# Patient Record
Sex: Male | Born: 1965
Health system: Southern US, Community
[De-identification: ages and names within clinical notes are randomized; demographics above are authoritative.]

## PROBLEM LIST (undated history)

## (undated) DIAGNOSIS — Z789 Other specified health status: Secondary | ICD-10-CM

## (undated) HISTORY — PX: KNEE SURGERY: SHX244

## (undated) HISTORY — PX: BACK SURGERY: SHX140

---

## 1988-01-29 HISTORY — PX: LUMBAR LAMINECTOMY/DECOMPRESSION MICRODISCECTOMY: SHX5026

## 1991-01-29 HISTORY — PX: KNEE SURGERY: SHX244

## 2002-07-05 ENCOUNTER — Ambulatory Visit (HOSPITAL_COMMUNITY): Admission: RE | Admit: 2002-07-05 | Discharge: 2002-07-05 | Payer: Self-pay | Admitting: Endocrinology

## 2002-07-05 ENCOUNTER — Encounter: Payer: Self-pay | Admitting: Endocrinology

## 2005-11-01 ENCOUNTER — Emergency Department (HOSPITAL_COMMUNITY): Admission: EM | Admit: 2005-11-01 | Discharge: 2005-11-01 | Payer: Self-pay | Admitting: Family Medicine

## 2009-06-28 ENCOUNTER — Ambulatory Visit: Payer: Self-pay | Admitting: Family Medicine

## 2010-02-27 NOTE — Assessment & Plan Note (Signed)
Summary: HEAD COLD   Vital Signs:  Patient Profile:   45 Years Old Male CC:      Chest congestion, headache, cough x 3 days Height:     72 inches Weight:      210 pounds O2 Sat:      99 % O2 treatment:    Room Air Temp:     98.4 degrees F oral Pulse rate:   51 / minute Pulse rhythm:   regular Resp:     12 per minute BP sitting:   136 / 88  (right arm) Cuff size:   large  Vitals Entered By: Emilio Math (June 28, 2009 8:27 AM)                  Prior Medication List:  No prior medications documented  Current Allergies: No known allergies History of Present Illness Chief Complaint: Chest congestion, headache, cough x 3 days History of Present Illness: Chest congestion and cough started 3 days ago. Yellowish productive sputum that has gotten worse today. Felt so bad he called into work today.   Current Problems: ACUTE NASOPHARYNGITIS (ICD-460) UPPER RESPIRATORY INFECTION, ACUTE (ICD-465.9)   Current Meds TUSSIONEX PENNKINETIC ER 8-10 MG/5ML LQCR (CHLORPHENIRAMINE-HYDROCODONE) sig 1 tsp by mouth twice a day MUCINEX D 60-600 MG XR12H-TAB (PSEUDOEPHEDRINE-GUAIFENESIN) sig 1 by mouth twice aday ZITHROMAX Z-PAK 250 MG  TABS (AZITHROMYCIN) Use as directed may fill if not better between 6/4-6/12/2009 * NASONEX OR FLONASE NASAL SPRAY 1 puff q day as needed  REVIEW OF SYSTEMS Constitutional Symptoms      Denies fever, chills, night sweats, weight loss, weight gain, and fatigue.  Eyes       Denies change in vision, eye pain, eye discharge, glasses, contact lenses, and eye surgery. Ear/Nose/Throat/Mouth       Complains of sinus problems.      Denies hearing loss/aids, change in hearing, ear pain, ear discharge, dizziness, frequent runny nose, frequent nose bleeds, hoarseness, and tooth pain or bleeding.  Respiratory       Complains of dry cough.      Denies productive cough, wheezing, shortness of breath, asthma, bronchitis, and emphysema/COPD.  Cardiovascular       Denies  murmurs, chest pain, and tires easily with exhertion.    Gastrointestinal       Denies stomach pain, nausea/vomiting, diarrhea, constipation, blood in bowel movements, and indigestion. Genitourniary       Denies painful urination, kidney stones, and loss of urinary control. Neurological       Complains of headaches.      Denies paralysis, seizures, and fainting/blackouts. Musculoskeletal       Denies muscle pain, joint pain, joint stiffness, decreased range of motion, redness, swelling, muscle weakness, and gout.  Skin       Denies bruising, unusual mles/lumps or sores, and hair/skin or nail changes.  Psych       Denies mood changes, temper/anger issues, anxiety/stress, speech problems, depression, and sleep problems.  Past History:  Family History: Last updated: 06/28/2009 Mother, Breast CA Father, Healthy  Social History: Last updated: 06/28/2009 Non-smoker No ETOH No Drugs [facility]  Past Medical History: Unremarkable  Past Surgical History: Denies surgical history  Family History: Reviewed history and no changes required. Mother, Breast CA Father, Healthy  Social History: Reviewed history and no changes required. Non-smoker No ETOH No Drugs [facility] Physical Exam General appearance: well developed, well nourished, no acute distress Head: normocephalic, atraumatic Nasal: mucosa pink, nonedematous, no septal  deviation, turbinates normal Oral/Pharynx: tongue normal, posterior pharynx without erythema or exudate Neck: supple,anterior lymphadenopathy present Chest/Lungs: no rales, wheezes, or rhonchi bilateral, breath sounds equal without effort Heart: regular rate and  rhythm, no murmur Skin: no obvious rashes or lesions MSE: oriented to time, place, and person Assessment New Problems: ACUTE NASOPHARYNGITIS (ICD-460) UPPER RESPIRATORY INFECTION, ACUTE (ICD-465.9)  URI   naso pharyngitis  Patient Education: Patient and/or caregiver instructed in the  following: rest fluids and Tylenol.  Plan New Medications/Changes: NASONEX OR FLONASE NASAL SPRAY 1 puff q day as needed  #0 x 0, 06/28/2009, Hassan Rowan MD ZITHROMAX Z-PAK 250 MG  TABS (AZITHROMYCIN) Use as directed may fill if not better between 6/4-6/12/2009  #1 x 0, 06/28/2009, Hassan Rowan MD MUCINEX D 60-600 MG XR12H-TAB (PSEUDOEPHEDRINE-GUAIFENESIN) sig 1 by mouth twice aday  #60 x 0, 06/28/2009, Hassan Rowan MD TUSSIONEX PENNKINETIC ER 8-10 MG/5ML LQCR (CHLORPHENIRAMINE-HYDROCODONE) sig 1 tsp by mouth twice a day  #58floz x 0, 06/28/2009, Hassan Rowan MD  New Orders: New Patient Level III [phone] Follow Up: Follow up on an as needed basis, Follow up with Primary Physician Work/School Excuse: Return to work/school in 3 days  The patient and/or caregiver has been counseled thoroughly with regard to medications prescribed including dosage, schedule, interactions, rationale for use, and possible side effects and they verbalize understanding.  Diagnoses and expected course of recovery discussed and will return if not improved as expected or if the condition worsens. Patient and/or caregiver verbalized understanding.  Prescriptions: NASONEX OR FLONASE NASAL SPRAY 1 puff q day as needed  #0 x 0   Entered and Authorized by:   Hassan Rowan MD   Signed by:   Hassan Rowan MD on 06/28/2009   Method used:   Print then Give to Patient   RxID:   [id number] ZITHROMAX Z-PAK 250 MG  TABS (AZITHROMYCIN) Use as directed may fill if not better between 6/4-6/12/2009  #1 x 0   Entered and Authorized by:   Hassan Rowan MD   Signed by:   Hassan Rowan MD on 06/28/2009   Method used:   Print then Give to Patient   RxID:   [id number] XLKGMWN D 60-600 MG XR12H-TAB (PSEUDOEPHEDRINE-GUAIFENESIN) sig 1 by mouth twice aday  #60 x 0   Entered and Authorized by:   Hassan Rowan MD   Signed by:   Hassan Rowan MD on 06/28/2009   Method used:   Print then Give to Patient   RxID:   [id number] TUSSIONEX  PENNKINETIC ER 8-10 MG/5ML LQCR (CHLORPHENIRAMINE-HYDROCODONE) sig 1 tsp by mouth twice a day  #1floz x 0   Entered and Authorized by:   Hassan Rowan MD   Signed by:   Hassan Rowan MD on 06/28/2009   Method used:   Print then Give to Patient   RxID:   [id number]   Patient Instructions: 1)  Please schedule an appointment with your primary doctor in : 2)   a follow-up appointment as needed. 3)  Acute sinusitis symptoms for less than 10 days are not helped by antibiotics.Use warm moist compresses, and over the counter decongestants ( only as directed). Call if no improvement in 5-7 days, sooner if increasing pain, fever, or new symptoms. 4)  Recommended remaining out of work for next 3 days.  Orders Added: 1)  New Patient Level III [88416]

## 2010-02-27 NOTE — Letter (Signed)
Summary: Out of Work  [facility]  [address]   [place], [place] [address]   Phone: [phone]  Fax: [phone]    June 28, 2009   Employee:  Jeffrey [employer]    To Whom It May Concern:   For Medical reasons, please excuse the above named employee from work for the following dates:  Start:   06/28/2009  Return:   07/01/2009  If you need additional information, please feel free to contact our office.         Sincerely,    Hassan Rowan MD

## 2012-01-09 ENCOUNTER — Ambulatory Visit (INDEPENDENT_AMBULATORY_CARE_PROVIDER_SITE_OTHER): Payer: 59 | Admitting: Internal Medicine

## 2012-01-09 ENCOUNTER — Ambulatory Visit: Payer: Self-pay | Admitting: Internal Medicine

## 2012-01-09 ENCOUNTER — Encounter: Payer: Self-pay | Admitting: Internal Medicine

## 2012-01-09 VITALS — BP 138/82 | HR 56 | Resp 16 | Wt 237.0 lb

## 2012-01-09 DIAGNOSIS — M549 Dorsalgia, unspecified: Secondary | ICD-10-CM

## 2012-01-09 DIAGNOSIS — M5416 Radiculopathy, lumbar region: Secondary | ICD-10-CM

## 2012-01-09 DIAGNOSIS — IMO0002 Reserved for concepts with insufficient information to code with codable children: Secondary | ICD-10-CM

## 2012-01-09 NOTE — Progress Notes (Signed)
  Subjective:    Patient ID: Jeffrey Burton, male    DOB: 07-01-1965, 46 y.o.   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 This is his first visit today. He has had back now for about 3 weeks radiating down left buttock into the left leg and left lateral leg. Hurts a lot at night. Hasn't been able to sleep well. Says that he had a lumbar procedure at [facility] in [place]  which sounds like a microdisectomy. Thinks it might have been at the L4-L5 level but cannot remember for sure. Doesn't recall any recent injury. He did try [place] machine at the gym about 3 or so weeks ago for some 45 minutes before onset of this pain. His general health is good. He's been taking some ibuprofen for the pain.  He is a native of [place] and formerly served in the [employer] in [place]. Subsequently moved to [place] to work in the [employer] and then to [place]. 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  History of fractured collarbone in the remote past. No chronic medications. No known drug allergies.  He is married and completed 2 years of college. Says he had a bout of herpes zoster a few years ago when going through a divorce. Patient does not smoke. Social alcohol consumption. One stepson and one son.  Family history: Father with history of hypertension and obesity living at age 36. Mother died of metastatic breast cancer. One brother age 28 who state of health is unknown to patient.    Review of Systems pain in the left back radiating into the left buttock and down left posterior thigh. Also pain left lateral ankle. No weakness in left lower extremity but finds himself unable to plan foot flat on the ground. Unable to sit still or stand still for any length of time because of discomfort     Objective:   Physical Exam straight leg raising is positive at 90 on the left.  Negative at 90 on the right. Deep tendon reflexes are 2+ and symmetrical in the lower extremities. Muscle strength is 5 over 5 in the left lower extremity. No foot drop is noted.        Assessment & Plan:  History of left lumbar disc disease status post discectomy 1992  New onset probable L5-S1 radiculopathy  Plan: Sterapred DS 10 mg 12 day dosepak. MRI of the LS-spine. Flexeril 10 mg proceed #30) 1/2-1 by mouth each bedtime. Lorcet 10/650 (#90) one by mouth every 8 hours when necessary pain.

## 2012-01-09 NOTE — Patient Instructions (Addendum)
Take Sterapred DS 10 mg 12 day dosepak as directed. Take Flexeril 1/2-1 tablet at night. Take Lorcet sparingly for pain. Have MRI of LS-spine and on December 15. Return in 2 weeks.

## 2012-01-12 ENCOUNTER — Other Ambulatory Visit: Payer: 59

## 2012-01-12 ENCOUNTER — Ambulatory Visit
Admission: RE | Admit: 2012-01-12 | Discharge: 2012-01-12 | Disposition: A | Discharge status: Home / Self Care | Payer: 59 | Source: Ambulatory Visit | Attending: Internal Medicine | Admitting: Internal Medicine

## 2012-01-12 DIAGNOSIS — M549 Dorsalgia, unspecified: Secondary | ICD-10-CM

## 2012-01-12 MED ORDER — GADOBENATE DIMEGLUMINE 529 MG/ML IV SOLN
20.0000 mL | Freq: Once | INTRAVENOUS | Status: AC | PRN
  Administered 2012-01-12: 20 mL via INTRAVENOUS

## 2012-01-13 NOTE — Progress Notes (Signed)
Patient informed. 

## 2012-02-06 ENCOUNTER — Telehealth: Payer: Self-pay | Admitting: Internal Medicine

## 2012-02-06 NOTE — Telephone Encounter (Signed)
He is asking you for a referral to a physician that you feel would best be suited to assist him with the issues he is/has been having.

## 2012-02-06 NOTE — Telephone Encounter (Signed)
Please see if Dr. Venetia Maxon will see him

## 2012-02-06 NOTE — Telephone Encounter (Signed)
Had suggested neurosurgeon previously but pt. Wanted to wait. Who does he want to see?

## 2012-02-07 NOTE — Telephone Encounter (Signed)
Referral faxed to Dr. Venetia Maxon at [facility]

## 2012-02-19 ENCOUNTER — Encounter (HOSPITAL_COMMUNITY): Payer: Self-pay | Admitting: Pharmacy Technician

## 2012-02-19 ENCOUNTER — Other Ambulatory Visit: Payer: Self-pay | Admitting: Neurosurgery

## 2012-02-20 ENCOUNTER — Encounter (HOSPITAL_COMMUNITY)
Admission: RE | Admit: 2012-02-20 | Discharge: 2012-02-20 | Disposition: A | Discharge status: Home / Self Care | Payer: 59 | Source: Ambulatory Visit | Attending: Neurosurgery | Admitting: Neurosurgery

## 2012-02-20 ENCOUNTER — Encounter (HOSPITAL_COMMUNITY): Payer: Self-pay | Admitting: Pharmacy Technician

## 2012-02-20 ENCOUNTER — Encounter (HOSPITAL_COMMUNITY): Payer: Self-pay

## 2012-02-20 HISTORY — DX: Other specified health status: Z78.9

## 2012-02-20 LAB — CBC
HCT: 41.7 % (ref 39.0–52.0)
MCH: 29.3 pg (ref 26.0–34.0)
MCHC: 35 g/dL (ref 30.0–36.0)
MCV: 83.7 fL (ref 78.0–100.0)
Platelets: 258 10*3/uL (ref 150–400)
RDW: 12.8 % (ref 11.5–15.5)

## 2012-02-20 MED ORDER — CEFAZOLIN SODIUM-DEXTROSE 2-3 GM-% IV SOLR
2.0000 g | INTRAVENOUS | Status: AC
  Administered 2012-02-21: 2 g via INTRAVENOUS

## 2012-02-20 NOTE — H&P (Signed)
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 He describes left thigh and calf pain radiating into his shin. He said he cannot walk and has to "clomp my foot".  He says this has been going on for seven weeks.  It is now becoming disabling. He is unable to sleep and unable to sit.  He has to kneel at his desk and describes that he is "miserable".  He describes the most intense pain other than the pain his left buttock is on the side of his left shin.  He had a previous left L4-5 discectomy in 09/1990 by Dr. Rutha Burton.  He has had intermittent flare-ups for about a year, but has had increased intensity and frequency from November through December, but as of December 10th through the 15th his pain became much more severe. He says it is constant lately.  He has taken Hydrocodone which causes severe nausea.  Motrin also causes nausea.  He does yoga exercises, but says this increase his pain. He is otherwise healthy.    REVIEW OF SYSTEMS:   A detailed Review of Systems sheet was reviewed with the patient.  Pertinent positives include under cardiovascular - he notes leg pain with walking, under musculoskeletal - he notes leg pain. All other systems are negative; this includes Constitutional symptoms, Eyes, Ears, nose, mouth, throat, Endocrine, Respiratory, Gastrointestinal, Genitourinary, Integumentary & Breast, Neurologic, Psychiatric, Hematologic/Lymphatic, Allergic/Immunologic.    PAST MEDICAL HISTORY:      Current Medical Conditions:    As previously described above.     Prior Operations and Hospitalizations:   As previously described above.      Medications and Allergies:  Current medications - he takes no scheduled medications.  He has no known drug allergies.      Height and Weight:     He is 6'  tall, 220 lbs.  BMI is 29.5.    FAMILY HISTORY:    His mother died at age 48 with metastatic breast cancer.  His father is 72 with diabetes and high blood pressure.    SOCIAL HISTORY:    He denies tobacco or drug use.  He is a social drinker of alcoholic beverages.    DIAGNOSTIC STUDIES:   Mr. Reali recently started seeing Dr. Lenord Burton, his primary care physician, who referred him to me.  She had an MRI performed of his lumbar spine which shows a previous laminectomy at L4-5 on the left, with mild facet arthropathy and a left-sided disc extrusion into the lateral recess sign significant left L5 nerve root encroachment.  No apparent L4 nerve root encroachment.    Plain radiographs were reviewed in the office which show a previous laminectomy at L4-5 and spondylosis and degenerative changes at this level.    PHYSICAL EXAMINATION:      General Appearance:   On examination today, Jeffrey Burton is a pleasant and cooperative man in no acute distress.      Blood Pressure, Pulse:     His blood pressure is 140/94.  Heart rate is 80 and regular.  Respiratory rate is 16.      HEENT - normocephalic, atraumatic.  The pupils are equal, round and reactive to light.  The extraocular muscles are intact.  Sclerae - white.  Conjunctiva - pink.  Oropharynx benign.  Uvula midline.     Neck - there are no masses, meningismus, deformities, tracheal deviation, jugular vein distention or carotid bruits.  There is normal cervical range of motion.  Spurlings' test is negative without reproducible radicular pain turning the patient's head to either side.  Lhermitte's sign is not present with axial compression.      Respiratory - there is normal respiratory effort with good intercostal function.  Lungs are clear to auscultation.  There are no rales, rhonchi or wheezes.      Cardiovascular - the heart has regular rate and rhythm to auscultation.  No murmurs are appreciated.  There is no extremity edema, cyanosis or  clubbing.  There are palpable pedal pulses.      Abdomen - soft, nontender, no hepatosplenomegaly appreciated or masses.  There are active bowel sounds.  No guarding or rebound.        Musculoskeletal Examination - he is very limited in both standing and also in forward bending. He is able to flex to 10 degrees off the vertical.  He has left sciatic notch discomfort.  He has midline low back pain to palpation.  He is able to stand on his toes on the right, decreased ability to stand on his heel on the left.  He has a positive straight leg raise at 15 degrees.    NEUROLOGICAL EXAMINATION: The patient is oriented to time, person and place and has good recall of both recent and remote memory with normal attention span and concentration.  The patient speaks with clear and fluent speech and exhibits normal language function and appropriate fund of knowledge.      Cranial Nerve Examination - pupils are equal, round and reactive to light.  Extraocular movements are full.  Visual fields are full to confrontational testing.  Facial sensation and facial movement are symmetric and intact.  Hearing is intact to finger rub.  Palate is upgoing.  Shoulder shrug is symmetric.  Tongue protrudes in the midline.      Motor Examination - motor strength is 5/5 in the bilateral deltoids, biceps, triceps, handgrips, wrist extensors, interosseous.  In the lower extremities motor strength is 5/5 in hip flexion, extension, quadriceps, hamstrings, plantar flexion, dorsiflexion, 5/5 right extensor hallucis longus, and left extensor hallucis longus strength at 4/5 and left hip abductor strength at 4/5.    Sensory Examination - normal to light touch and pinprick sensation in the upper and lower extremities.     Deep Tendon Reflexes - 2 in the biceps, triceps, and brachioradialis, 2 in the knees, 2 in the ankles.  The great toes are downgoing to plantar stimulation.      Cerebellar Examination - normal coordination in upper and  lower extremities and normal rapid alternating movements.  Romberg test is negative.    IMPRESSION AND RECOMMENDATIONS: Jeffrey Burton is a 47 year old man with a severe left L5 radiculopathy with recurrent disc herniation at L4-5 on the left.  I have recommended that he undergo re-do left L4-5 microdiscectomy and have set this up on an expedited basis in the next few days to hopefully give him some relief of his severe and incapacitating pain.  Risks and benefits were discussed with the patient who wishes to proceed.   I reviewed the studies with the patient and went over his physical examination.  I reviewed surgical models and discussed the typical hospital course and operative and postoperative course and the potential risks and benefits of surgery.  The risks of  surgery were discussed in detail and include, but are not limited to, the risks of anesthesia, blood loss and the possibility of hemorrhage, infection, damage to nerves, damage to blood vessels, injury to the lumbar nerve root causing either temporary or permanent leg pain, numbness, weakness.  There is potential for spinal fluid leak from dural tear.  There is the potential for post-laminectomy spondylolisthesis, recurrent disc ruptured quoted at approximately 10%, failure to relieve pain, worsening of pain, need for further surgery.    [facility]    Danae Orleans. Venetia Maxon, M.D.  JDS:aft cc: Dr. Eden Emms [facility]

## 2012-02-20 NOTE — Progress Notes (Signed)
Primary Physician - Dr. Lenord Fellers Does not have a cardiologist - no previous cardiac testing.

## 2012-02-20 NOTE — Pre-Procedure Instructions (Signed)
Jeffrey Burton  02/20/2012   Your procedure is scheduled on:  Friday, January 24th  Report to [facility] at 0530 AM.  Call this number if you have problems the morning of surgery: [phone]   Remember:   Do not eat food or drink liquids after midnight.    Take these medicines the morning of surgery with A SIP OF WATER: percocet if needed   Do not wear jewelry, make-up or nail polish.  Do not wear lotions, powders, or perfumes. You may not wear deodorant.  Do not shave 48 hours prior to surgery. Men may shave face and neck.  Do not bring valuables to the hospital.  Contacts, dentures or bridgework may not be worn into surgery.  Leave suitcase in the car. After surgery it may be brought to your room.  For patients admitted to the hospital, checkout time is 11:00 AM the day of  discharge.   Patients discharged the day of surgery will not be allowed to drive  home.    Special Instructions: Shower using CHG 2 nights before surgery and the night before surgery.  If you shower the day of surgery use CHG.  Use special wash - you have one bottle of CHG for all showers.  You should use approximately 1/3 of the bottle for each shower.   Please read over the following fact sheets that you were given: Pain Booklet, Coughing and Deep Breathing, MRSA Information and Surgical Site Infection Prevention

## 2012-02-21 ENCOUNTER — Ambulatory Visit (HOSPITAL_COMMUNITY)
Admission: RE | Admit: 2012-02-21 | Discharge: 2012-02-21 | Disposition: A | Discharge status: Home / Self Care | Payer: 59 | Source: Ambulatory Visit | Attending: Neurosurgery | Admitting: Neurosurgery

## 2012-02-21 ENCOUNTER — Ambulatory Visit (HOSPITAL_COMMUNITY): Payer: 59 | Admitting: Anesthesiology

## 2012-02-21 ENCOUNTER — Encounter (HOSPITAL_COMMUNITY): Payer: Self-pay | Admitting: *Deleted

## 2012-02-21 ENCOUNTER — Encounter (HOSPITAL_COMMUNITY): Payer: Self-pay | Admitting: Anesthesiology

## 2012-02-21 ENCOUNTER — Encounter (HOSPITAL_COMMUNITY)
Admission: RE | Disposition: A | Discharge status: Home / Self Care | Payer: Self-pay | Source: Ambulatory Visit | Attending: Neurosurgery

## 2012-02-21 ENCOUNTER — Ambulatory Visit (HOSPITAL_COMMUNITY): Payer: 59

## 2012-02-21 DIAGNOSIS — M5137 Other intervertebral disc degeneration, lumbosacral region: Secondary | ICD-10-CM | POA: Insufficient documentation

## 2012-02-21 DIAGNOSIS — Z01812 Encounter for preprocedural laboratory examination: Secondary | ICD-10-CM | POA: Insufficient documentation

## 2012-02-21 DIAGNOSIS — M47817 Spondylosis without myelopathy or radiculopathy, lumbosacral region: Secondary | ICD-10-CM | POA: Insufficient documentation

## 2012-02-21 DIAGNOSIS — M51379 Other intervertebral disc degeneration, lumbosacral region without mention of lumbar back pain or lower extremity pain: Secondary | ICD-10-CM | POA: Insufficient documentation

## 2012-02-21 DIAGNOSIS — M5126 Other intervertebral disc displacement, lumbar region: Secondary | ICD-10-CM | POA: Insufficient documentation

## 2012-02-21 HISTORY — PX: LUMBAR LAMINECTOMY/DECOMPRESSION MICRODISCECTOMY: SHX5026

## 2012-02-21 SURGERY — LUMBAR LAMINECTOMY/DECOMPRESSION MICRODISCECTOMY 1 LEVEL
Anesthesia: General | Site: Back | Laterality: Left | Wound class: Clean

## 2012-02-21 MED ORDER — ZOLPIDEM TARTRATE 5 MG PO TABS: 5.0000 mg | ORAL_TABLET | Freq: Every evening | ORAL | Status: DC | PRN

## 2012-02-21 MED ORDER — NEOSTIGMINE METHYLSULFATE 1 MG/ML IJ SOLN
INTRAMUSCULAR | Status: DC | PRN
  Administered 2012-02-21: 3 mg via INTRAVENOUS

## 2012-02-21 MED ORDER — ACETAMINOPHEN 10 MG/ML IV SOLN
1000.0000 mg | Freq: Once | INTRAVENOUS | Status: AC
  Administered 2012-02-21: 1000 mg via INTRAVENOUS

## 2012-02-21 MED ORDER — BUPIVACAINE HCL (PF) 0.5 % IJ SOLN
INTRAMUSCULAR | Status: DC | PRN
  Administered 2012-02-21: 5 mL

## 2012-02-21 MED ORDER — KCL IN DEXTROSE-NACL 20-5-0.45 MEQ/L-%-% IV SOLN
INTRAVENOUS | Status: DC
  Filled 2012-02-21 (×2): qty 1000

## 2012-02-21 MED ORDER — PANTOPRAZOLE SODIUM 40 MG IV SOLR
40.0000 mg | Freq: Every day | INTRAVENOUS | Status: DC
  Filled 2012-02-21: qty 40

## 2012-02-21 MED ORDER — METHYLPREDNISOLONE ACETATE 80 MG/ML IJ SUSP
INTRAMUSCULAR | Status: DC | PRN
  Administered 2012-02-21: 80 mg

## 2012-02-21 MED ORDER — SODIUM CHLORIDE 0.9 % IJ SOLN: 3.0000 mL | Freq: Two times a day (BID) | INTRAMUSCULAR | Status: DC

## 2012-02-21 MED ORDER — DOCUSATE SODIUM 100 MG PO CAPS: 100.0000 mg | ORAL_CAPSULE | Freq: Two times a day (BID) | ORAL | Status: DC

## 2012-02-21 MED ORDER — ACETAMINOPHEN 650 MG RE SUPP: 650.0000 mg | RECTAL | Status: DC | PRN

## 2012-02-21 MED ORDER — FENTANYL CITRATE 0.05 MG/ML IJ SOLN: 50.0000 ug | Freq: Once | INTRAMUSCULAR | Status: DC

## 2012-02-21 MED ORDER — ROCURONIUM BROMIDE 100 MG/10ML IV SOLN
INTRAVENOUS | Status: DC | PRN
  Administered 2012-02-21: 50 mg via INTRAVENOUS

## 2012-02-21 MED ORDER — CEFAZOLIN SODIUM 1-5 GM-% IV SOLN
1.0000 g | Freq: Three times a day (TID) | INTRAVENOUS | Status: DC
  Administered 2012-02-21: 1 g via INTRAVENOUS
  Filled 2012-02-21 (×2): qty 50

## 2012-02-21 MED ORDER — CEFAZOLIN SODIUM-DEXTROSE 2-3 GM-% IV SOLR
INTRAVENOUS | Status: AC
  Filled 2012-02-21: qty 50

## 2012-02-21 MED ORDER — HEMOSTATIC AGENTS (NO CHARGE) OPTIME
TOPICAL | Status: DC | PRN
  Administered 2012-02-21: 1 via TOPICAL

## 2012-02-21 MED ORDER — HYDROMORPHONE HCL PF 1 MG/ML IJ SOLN
INTRAMUSCULAR | Status: AC
  Filled 2012-02-21: qty 1

## 2012-02-21 MED ORDER — SENNA 8.6 MG PO TABS: 1.0000 | ORAL_TABLET | Freq: Two times a day (BID) | ORAL | Status: DC

## 2012-02-21 MED ORDER — SENNOSIDES-DOCUSATE SODIUM 8.6-50 MG PO TABS
1.0000 | ORAL_TABLET | Freq: Every evening | ORAL | Status: DC | PRN
  Filled 2012-02-21: qty 1

## 2012-02-21 MED ORDER — ACETAMINOPHEN 10 MG/ML IV SOLN
INTRAVENOUS | Status: AC
  Filled 2012-02-21: qty 100

## 2012-02-21 MED ORDER — SODIUM CHLORIDE 0.9 % IV SOLN
INTRAVENOUS | Status: AC
  Filled 2012-02-21: qty 500

## 2012-02-21 MED ORDER — BISACODYL 10 MG RE SUPP: 10.0000 mg | Freq: Every day | RECTAL | Status: DC | PRN

## 2012-02-21 MED ORDER — HYDROCODONE-ACETAMINOPHEN 5-325 MG PO TABS: 1.0000 | ORAL_TABLET | ORAL | Status: DC | PRN

## 2012-02-21 MED ORDER — THROMBIN 5000 UNITS EX KIT
PACK | CUTANEOUS | Status: DC | PRN
  Administered 2012-02-21 (×2): 5000 [IU] via TOPICAL

## 2012-02-21 MED ORDER — DIAZEPAM 5 MG PO TABS: 5.0000 mg | ORAL_TABLET | Freq: Four times a day (QID) | ORAL | Status: DC | PRN

## 2012-02-21 MED ORDER — GLYCOPYRROLATE 0.2 MG/ML IJ SOLN
INTRAMUSCULAR | Status: DC | PRN
  Administered 2012-02-21: 0.4 mg via INTRAVENOUS

## 2012-02-21 MED ORDER — PHENOL 1.4 % MT LIQD: 1.0000 | OROMUCOSAL | Status: DC | PRN

## 2012-02-21 MED ORDER — MIDAZOLAM HCL 2 MG/2ML IJ SOLN: 1.0000 mg | INTRAMUSCULAR | Status: DC | PRN

## 2012-02-21 MED ORDER — ONDANSETRON HCL 4 MG/2ML IJ SOLN: 4.0000 mg | INTRAMUSCULAR | Status: DC | PRN

## 2012-02-21 MED ORDER — ARTIFICIAL TEARS OP OINT
TOPICAL_OINTMENT | OPHTHALMIC | Status: DC | PRN
  Administered 2012-02-21: 1 via OPHTHALMIC

## 2012-02-21 MED ORDER — SODIUM CHLORIDE 0.9 % IV SOLN: 250.0000 mL | INTRAVENOUS | Status: DC

## 2012-02-21 MED ORDER — HYDROMORPHONE HCL PF 1 MG/ML IJ SOLN
0.2500 mg | INTRAMUSCULAR | Status: DC | PRN
  Administered 2012-02-21 (×2): 0.5 mg via INTRAVENOUS

## 2012-02-21 MED ORDER — OXYCODONE HCL 5 MG/5ML PO SOLN: 5.0000 mg | Freq: Once | ORAL | Status: DC | PRN

## 2012-02-21 MED ORDER — LACTATED RINGERS IV SOLN
INTRAVENOUS | Status: DC | PRN
  Administered 2012-02-21 (×2): via INTRAVENOUS

## 2012-02-21 MED ORDER — FENTANYL CITRATE 0.05 MG/ML IJ SOLN
INTRAMUSCULAR | Status: DC | PRN
  Administered 2012-02-21: 100 ug via INTRAVENOUS

## 2012-02-21 MED ORDER — ONDANSETRON HCL 4 MG/2ML IJ SOLN
INTRAMUSCULAR | Status: DC | PRN
  Administered 2012-02-21: 4 mg via INTRAVENOUS

## 2012-02-21 MED ORDER — MIDAZOLAM HCL 5 MG/5ML IJ SOLN
INTRAMUSCULAR | Status: DC | PRN
  Administered 2012-02-21: 2 mg via INTRAVENOUS

## 2012-02-21 MED ORDER — 0.9 % SODIUM CHLORIDE (POUR BTL) OPTIME
TOPICAL | Status: DC | PRN
  Administered 2012-02-21: 1000 mL

## 2012-02-21 MED ORDER — ACETAMINOPHEN 325 MG PO TABS: 650.0000 mg | ORAL_TABLET | ORAL | Status: DC | PRN

## 2012-02-21 MED ORDER — FLEET ENEMA 7-19 GM/118ML RE ENEM
1.0000 | ENEMA | Freq: Once | RECTAL | Status: DC | PRN
  Filled 2012-02-21: qty 1

## 2012-02-21 MED ORDER — OXYCODONE-ACETAMINOPHEN 5-325 MG PO TABS: 1.0000 | ORAL_TABLET | ORAL | Status: DC | PRN

## 2012-02-21 MED ORDER — FENTANYL CITRATE 0.05 MG/ML IJ SOLN
INTRAMUSCULAR | Status: AC
  Filled 2012-02-21: qty 2

## 2012-02-21 MED ORDER — LIDOCAINE-EPINEPHRINE 1 %-1:100000 IJ SOLN
INTRAMUSCULAR | Status: DC | PRN
  Administered 2012-02-21: 5 mL

## 2012-02-21 MED ORDER — ALUM & MAG HYDROXIDE-SIMETH 200-200-20 MG/5ML PO SUSP: 30.0000 mL | Freq: Four times a day (QID) | ORAL | Status: DC | PRN

## 2012-02-21 MED ORDER — MORPHINE SULFATE 2 MG/ML IJ SOLN
1.0000 mg | INTRAMUSCULAR | Status: DC | PRN
  Administered 2012-02-21: 2 mg via INTRAVENOUS
  Filled 2012-02-21: qty 1

## 2012-02-21 MED ORDER — FENTANYL CITRATE 0.05 MG/ML IJ SOLN
INTRAMUSCULAR | Status: DC | PRN
  Administered 2012-02-21: 150 ug via INTRAVENOUS
  Administered 2012-02-21: 100 ug via INTRAVENOUS

## 2012-02-21 MED ORDER — SODIUM CHLORIDE 0.9 % IJ SOLN: 3.0000 mL | INTRAMUSCULAR | Status: DC | PRN

## 2012-02-21 MED ORDER — BACITRACIN 50000 UNITS IM SOLR
INTRAMUSCULAR | Status: AC
  Filled 2012-02-21: qty 1

## 2012-02-21 MED ORDER — LIDOCAINE HCL (CARDIAC) 20 MG/ML IV SOLN
INTRAVENOUS | Status: DC | PRN
  Administered 2012-02-21: 100 mg via INTRAVENOUS

## 2012-02-21 MED ORDER — SODIUM CHLORIDE 0.9 % IR SOLN
Status: DC | PRN
  Administered 2012-02-21: 08:00:00

## 2012-02-21 MED ORDER — OXYCODONE HCL 5 MG PO TABS: 5.0000 mg | ORAL_TABLET | Freq: Once | ORAL | Status: DC | PRN

## 2012-02-21 MED ORDER — PROPOFOL 10 MG/ML IV BOLUS
INTRAVENOUS | Status: DC | PRN
  Administered 2012-02-21: 50 mg via INTRAVENOUS
  Administered 2012-02-21: 200 mg via INTRAVENOUS

## 2012-02-21 MED ORDER — OXYCODONE-ACETAMINOPHEN 5-325 MG PO TABS
1.0000 | ORAL_TABLET | ORAL | Status: DC | PRN
Start: 2012-02-21 — End: 2012-02-21

## 2012-02-21 MED ORDER — MENTHOL 3 MG MT LOZG: 1.0000 | LOZENGE | OROMUCOSAL | Status: DC | PRN

## 2012-02-21 MED ORDER — DEXTROSE 5 % IV SOLN
INTRAVENOUS | Status: DC | PRN
  Administered 2012-02-21 (×2): via INTRAVENOUS

## 2012-02-21 MED ORDER — PROMETHAZINE HCL 25 MG/ML IJ SOLN: 6.2500 mg | INTRAMUSCULAR | Status: DC | PRN

## 2012-02-21 SURGICAL SUPPLY — 61 items
ADH SKN CLS APL DERMABOND .7 (GAUZE/BANDAGES/DRESSINGS) ×1
APL SKNCLS STERI-STRIP NONHPOA (GAUZE/BANDAGES/DRESSINGS)
BAG DECANTER FOR FLEXI CONT (MISCELLANEOUS) ×2 IMPLANT
BENZOIN TINCTURE PRP APPL 2/3 (GAUZE/BANDAGES/DRESSINGS) IMPLANT
BIT DRILL NEURO 2X3.1 SFT TUCH (MISCELLANEOUS) ×1 IMPLANT
BLADE SURG ROTATE 9660 (MISCELLANEOUS) IMPLANT
BUR ROUND FLUTED 5 RND (BURR) ×2 IMPLANT
CANISTER SUCTION 2500CC (MISCELLANEOUS) ×2 IMPLANT
CLOTH BEACON ORANGE TIMEOUT ST (SAFETY) ×2 IMPLANT
CONT SPEC 4OZ CLIKSEAL STRL BL (MISCELLANEOUS) ×2 IMPLANT
DERMABOND ADVANCED (GAUZE/BANDAGES/DRESSINGS) ×1
DERMABOND ADVANCED .7 DNX12 (GAUZE/BANDAGES/DRESSINGS) ×1 IMPLANT
DRAPE LAPAROTOMY 100X72X124 (DRAPES) ×2 IMPLANT
DRAPE MICROSCOPE LEICA (MISCELLANEOUS) ×2 IMPLANT
DRAPE POUCH INSTRU U-SHP 10X18 (DRAPES) ×2 IMPLANT
DRAPE PROXIMA HALF (DRAPES) ×1 IMPLANT
DRAPE SURG 17X23 STRL (DRAPES) ×2 IMPLANT
DRESSING TELFA 8X3 (GAUZE/BANDAGES/DRESSINGS) IMPLANT
DRILL NEURO 2X3.1 SOFT TOUCH (MISCELLANEOUS) ×2
DURAPREP 26ML APPLICATOR (WOUND CARE) ×2 IMPLANT
ELECT REM PT RETURN 9FT ADLT (ELECTROSURGICAL) ×2
ELECTRODE REM PT RTRN 9FT ADLT (ELECTROSURGICAL) ×1 IMPLANT
GAUZE SPONGE 4X4 16PLY XRAY LF (GAUZE/BANDAGES/DRESSINGS) IMPLANT
GLOVE BIO SURGEON STRL SZ8 (GLOVE) ×2 IMPLANT
GLOVE BIOGEL PI IND STRL 7.0 (GLOVE) IMPLANT
GLOVE BIOGEL PI IND STRL 8 (GLOVE) ×1 IMPLANT
GLOVE BIOGEL PI IND STRL 8.5 (GLOVE) ×1 IMPLANT
GLOVE BIOGEL PI INDICATOR 7.0 (GLOVE) ×1
GLOVE BIOGEL PI INDICATOR 8 (GLOVE) ×1
GLOVE BIOGEL PI INDICATOR 8.5 (GLOVE) ×1
GLOVE ECLIPSE 8.0 STRL XLNG CF (GLOVE) ×2 IMPLANT
GLOVE EXAM NITRILE LRG STRL (GLOVE) IMPLANT
GLOVE EXAM NITRILE MD LF STRL (GLOVE) IMPLANT
GLOVE EXAM NITRILE XL STR (GLOVE) IMPLANT
GLOVE EXAM NITRILE XS STR PU (GLOVE) IMPLANT
GLOVE SURG SS PI 7.0 STRL IVOR (GLOVE) ×3 IMPLANT
GOWN BRE IMP SLV AUR LG STRL (GOWN DISPOSABLE) IMPLANT
GOWN BRE IMP SLV AUR XL STRL (GOWN DISPOSABLE) ×2 IMPLANT
GOWN STRL REIN 2XL LVL4 (GOWN DISPOSABLE) ×2 IMPLANT
KIT BASIN OR (CUSTOM PROCEDURE TRAY) ×2 IMPLANT
KIT ROOM TURNOVER OR (KITS) ×2 IMPLANT
NDL HYPO 18GX1.5 BLUNT FILL (NEEDLE) IMPLANT
NDL HYPO 25X1 1.5 SAFETY (NEEDLE) ×1 IMPLANT
NEEDLE HYPO 18GX1.5 BLUNT FILL (NEEDLE) ×2 IMPLANT
NEEDLE HYPO 25X1 1.5 SAFETY (NEEDLE) ×2 IMPLANT
NS IRRIG 1000ML POUR BTL (IV SOLUTION) ×2 IMPLANT
PACK LAMINECTOMY NEURO (CUSTOM PROCEDURE TRAY) ×2 IMPLANT
PAD ARMBOARD 7.5X6 YLW CONV (MISCELLANEOUS) ×6 IMPLANT
RUBBERBAND STERILE (MISCELLANEOUS) ×4 IMPLANT
SPONGE GAUZE 4X4 12PLY (GAUZE/BANDAGES/DRESSINGS) IMPLANT
SPONGE SURGIFOAM ABS GEL SZ50 (HEMOSTASIS) ×2 IMPLANT
STRIP CLOSURE SKIN 1/2X4 (GAUZE/BANDAGES/DRESSINGS) IMPLANT
SUT VIC AB 0 CT1 18XCR BRD8 (SUTURE) ×1 IMPLANT
SUT VIC AB 0 CT1 8-18 (SUTURE) ×2
SUT VIC AB 2-0 CT1 18 (SUTURE) ×2 IMPLANT
SUT VIC AB 3-0 SH 8-18 (SUTURE) ×2 IMPLANT
SYR 20ML ECCENTRIC (SYRINGE) ×2 IMPLANT
SYR 5ML LL (SYRINGE) ×1 IMPLANT
TOWEL OR 17X24 6PK STRL BLUE (TOWEL DISPOSABLE) ×2 IMPLANT
TOWEL OR 17X26 10 PK STRL BLUE (TOWEL DISPOSABLE) ×2 IMPLANT
WATER STERILE IRR 1000ML POUR (IV SOLUTION) ×2 IMPLANT

## 2012-02-21 NOTE — Progress Notes (Signed)
Pt doing very well. Pt and wife given D/C instructions with verbal understanding. Pt D/C'd home via wheelchair @ 1700 per MD order. Rema Fendt, RN

## 2012-02-21 NOTE — Progress Notes (Signed)
Doing well  DC home

## 2012-02-21 NOTE — Interval H&P Note (Signed)
History and Physical Interval Note:  02/21/2012 7:00 AM  Jeffrey Burton  has presented today for surgery, with the diagnosis of Lumbar hnp without myelopathy, Lumbar spondylosis, Lumbar degnerative disc disease, Lumbar radiculopathy  The various methods of treatment have been discussed with the patient and family. After consideration of risks, benefits and other options for treatment, the patient has consented to  Procedure(s) (LRB) with comments: LUMBAR LAMINECTOMY/DECOMPRESSION MICRODISCECTOMY 1 LEVEL (Left) - Redo Left L4-5 Microdiskectomy as a surgical intervention .  The patient's history has been reviewed, patient examined, no change in status, stable for surgery.  I have reviewed the patient's chart and labs.  Questions were answered to the patient's satisfaction.     Sheretha Shadd D  Date of Initial H&P: 02/19/2012  History reviewed, patient examined, no change in status, stable for surgery.

## 2012-02-21 NOTE — Op Note (Signed)
02/21/2012  9:01 AM  PATIENT:  Jeffrey Burton  46 y.o. male  PRE-OPERATIVE DIAGNOSIS:  Recurrent left lumbar four-five herniated nucleus pulposus without myelopathy, spondylosis, degnerative disc disease, radiculopathy  POST-OPERATIVE DIAGNOSIS: Recurrent left lumbar four-five herniated nucleus pulposus without myelopathy, spondylosis, degnerative disc disease, radiculopathy  PROCEDURE:  Procedure(s) (LRB) with comments: LUMBAR LAMINECTOMY/DECOMPRESSION MICRODISCECTOMY 1 LEVEL (Left) - Redo Left Lumbar four-five Microdiskectomy with microdissection  SURGEON:  Surgeon(s) and Role:    * Wheeler Incorvaia, MD - Primary    * Robert W Nudelman, MD - Assisting  PHYSICIAN ASSISTANT:   ASSISTANTS: none   ANESTHESIA:   general  EBL:  Total I/O In: 1150 [I.V.:1150] Out: 20 [Blood:20]  BLOOD ADMINISTERED:none  DRAINS: none   LOCAL MEDICATIONS USED:  LIDOCAINE   SPECIMEN:  No Specimen  DISPOSITION OF SPECIMEN:  N/A  COUNTS:  YES  TOURNIQUET:  * No tourniquets in log *  DICTATION:  DICTATION: Patient has a recurrent L4-5 disc rupture on the left with significant left leg weakness and pain. It was elected to take him to surgery for redo left L4-5 microdiscectomy on an urgent basis. His prior discectomy was in 1992.  Procedure: Patient was brought to the operating room and following the smooth and uncomplicated induction of general endotracheal anesthesia he was placed in a prone position on the Wilson frame. Low back was prepped and draped in the usual sterile fashion with Betadine scrub followed by DuraPrep. Area of planned incision was infiltrated with local lidocaine. Incision was made in the midline and carried to the lumbodorsal fascia which was incised on the left side of midline. Subperiosteal dissection was performed exposing what was felt to be L45 level and this was confirmed on intraoperative Xray. The previous bony defect was defined and bone removal was extended in each  direction with the high speed drill and completed with Kerrison rongeurs and a generous foraminotomy was performed overlying the superior aspect of the L5 lamina. Scar tissue was detached and removed in a piecemeal fashion and under the microscope, it was possible to see that a large piece of disc material was removed from beneath the  L 5 nerve root, displacing it medially and displacing the thecal sac medially.  Using painstaking microdissection, I was able to mobilize these neural elements and then removed additional  disc material which extended into the interspace.  I was able at this point to mobilize the nerve medially and to palpate along its course with ball hooks and confirm that there were no additional compressive disc fragments. The medial and lateral aspects of the interspace were further cleared of disc material.  The interspace was then irrigated with bacitracin saline and no additional disc material was mobilized. Hemostasis was assured with bipolar electrocautery and the interspace was irrigated with Depo-Medrol and fentanyl. The lumbodorsal fascia was closed with 0 Vicryl sutures the subcutaneous tissues reapproximated 2-0 Vicryl inverted sutures and the skin edges were reapproximated with 3-0 Vicryl subcuticular stitch. The wound is dressed with Dermabond. Patient was extubated in the operating room and taken to recovery on a bariatric bed in stable and satisfactory condition having tolerated his operation well. Counts were correct at the end of the case.  PLAN OF CARE: Admit for overnight observation  PATIENT DISPOSITION:  PACU - hemodynamically stable.   Delay start of Pharmacological VTE agent (>24hrs) due to surgical blood loss or risk of bleeding: yes  

## 2012-02-21 NOTE — Anesthesia Preprocedure Evaluation (Addendum)
Anesthesia Evaluation  Patient identified by MRN, date of birth, ID band Patient awake    Reviewed: Allergy & Precautions, H&P , NPO status , Patient's Chart, lab work & pertinent test results  Airway Mallampati: I TM Distance: >3 FB     Dental  (+) Teeth Intact and Dental Advisory Given   Pulmonary neg pulmonary ROS,  breath sounds clear to auscultation  Pulmonary exam normal       Cardiovascular negative cardio ROS  Rhythm:Regular Rate:Normal     Neuro/Psych with intravenous contrast.   Contrast: 20mL MULTIHANCE GADOBENATE DIMEGLUMINE 529 MG/ML IV SOLN   Comparison: None.   Findings: Anatomic alignment.  Disc space narrowing L4-L5 with desiccation.  Chronic endplate reactive changes reflects previous surgery at the L4-5 level.  Normal conus.  Marrow signal homogeneous.  No paravertebral abnormalities.   The individual disc spaces are examined as follows:   L1-2:  Normal.   L2-3:  Normal.   L3-4:  Normal.   L4-5:  Left hemilaminotomy. Mild bilateral facet arthropathy is worse on the left.  There is a central and leftward disc extrusion which extends into the lateral recess.  Significant left L5 nerve root encroachment is present. There is no apparent left L4 nerve root irritation.   L5-S1:  Normal disc space.  Mild facet arthropathy.   Post contrast enhancement demonstrates no epidural fibrosis, but prominent peridiskal enhancement at L4-5 is noted.  There are no signs of infection.   IMPRESSION: Recurrent central and leftward disc extrusion at L4-L5. Significant left L5 nerve root encroachment.  Mild bilateral facet arthropathy.  12/13  Neuromuscular disease    GI/Hepatic negative GI ROS, Neg liver ROS,   Endo/Other    Renal/GU negative Renal ROS  negative genitourinary   Musculoskeletal   Abdominal   Peds  Hematology negative hematology ROS (+)   Anesthesia Other Findings   Reproductive/Obstetrics                         Anesthesia Physical Anesthesia Plan  ASA: I  Anesthesia Plan: General   Post-op Pain Management:    Induction: Intravenous  Airway Management Planned: Oral ETT  Additional Equipment:   Intra-op Plan:   Post-operative Plan: Extubation in OR  Informed Consent: I have reviewed the patients History and Physical, chart, labs and discussed the procedure including the risks, benefits and alternatives for the proposed anesthesia with the patient or authorized representative who has indicated his/her understanding and acceptance.   Dental advisory given  Plan Discussed with: CRNA and Surgeon  Anesthesia Plan Comments:        Anesthesia Quick Evaluation

## 2012-02-21 NOTE — Progress Notes (Signed)
Subjective: Patient reports "I feel good...no pain!"  Objective: Vital signs in last 24 hours: Temp:  [96.8 F (36 C)-97.9 F (36.6 C)] 97.5 F (36.4 C) (01/24 1225) Pulse Rate:  [46-62] 61 (0124 @ 1615) Resp:  [18-29] 16 (01/24 1615) BP: (129-147)/(61-90) 139/71 mmHg (01/24 1225) SpO2:  [95 %-100 %] 100 % (01/24 1225)  Intake/Output from previous day:   Intake/Output this shift: Total I/O In: 2310 [P.O.:960; I.V.:1350] Out: 20 [Blood:20]  Alert, conversant, without c/o leg or back pain. Incision with Dermabond; no erythema, swelling, or drainage. Good strength BLE. Ambulates w/o difficulty.   Lab Results:  Basename 02/20/12 1124  WBC 7.5  HGB 14.6  HCT 41.7  PLT 258   BMET No results found for this basename: NA:2,K:2,CL:2,CO2:2,GLUCOSE:2,BUN:2,CREATININE:2,CALCIUM:2 in the last 72 hours  Studies/Results: Dg Lumbar Spine 1 View  02/21/2012  *RADIOLOGY REPORT*  Clinical Data: L4-5 discectomy.  LUMBAR SPINE - 1 VIEW  Comparison: 02/19/2012  Findings: Post surgical instruments are in place and directed at the L4-5 level.  IMPRESSION: Intraoperative localization as above.   Original Report Authenticated By: Charlett Nose, M.D.     Assessment/Plan: Improved  LOS: 0 days  Per Dr. Venetia Maxon, d/c IV, d/c to home. Pt & wife verbalize understanding of d/c instructions & agree to call office on Monday to schedule 3wk f/u appt with Dr. Venetia Maxon.   Georgiann Cocker 02/21/2012, 4:13 PM

## 2012-02-21 NOTE — Brief Op Note (Signed)
02/21/2012  9:01 AM  PATIENT:  Jeffrey Burton  47 y.o. male  PRE-OPERATIVE DIAGNOSIS:  Recurrent left lumbar four-five herniated nucleus pulposus without myelopathy, spondylosis, degnerative disc disease, radiculopathy  POST-OPERATIVE DIAGNOSIS: Recurrent left lumbar four-five herniated nucleus pulposus without myelopathy, spondylosis, degnerative disc disease, radiculopathy  PROCEDURE:  Procedure(s) (LRB) with comments: LUMBAR LAMINECTOMY/DECOMPRESSION MICRODISCECTOMY 1 LEVEL (Left) - Redo Left Lumbar four-five Microdiskectomy with microdissection  SURGEON:  Surgeon(s) and Role:    * Maeola Harman, MD - Primary    * Hewitt Shorts, MD - Assisting  PHYSICIAN ASSISTANT:   ASSISTANTS: none   ANESTHESIA:   general  EBL:  Total I/O In: 1150 [I.V.:1150] Out: 20 [Blood:20]  BLOOD ADMINISTERED:none  DRAINS: none   LOCAL MEDICATIONS USED:  LIDOCAINE   SPECIMEN:  No Specimen  DISPOSITION OF SPECIMEN:  N/A  COUNTS:  YES  TOURNIQUET:  * No tourniquets in log *  DICTATION:  DICTATION: Patient has a recurrent L4-5 disc rupture on the left with significant left leg weakness and pain. It was elected to take him to surgery for redo left L4-5 microdiscectomy on an urgent basis. His prior discectomy was in 1992.  Procedure: Patient was brought to the operating room and following the smooth and uncomplicated induction of general endotracheal anesthesia he was placed in a prone position on the Wilson frame. Low back was prepped and draped in the usual sterile fashion with Betadine scrub followed by DuraPrep. Area of planned incision was infiltrated with local lidocaine. Incision was made in the midline and carried to the lumbodorsal fascia which was incised on the left side of midline. Subperiosteal dissection was performed exposing what was felt to be L45 level and this was confirmed on intraoperative Xray. The previous bony defect was defined and bone removal was extended in each  direction with the high speed drill and completed with Kerrison rongeurs and a generous foraminotomy was performed overlying the superior aspect of the L5 lamina. Scar tissue was detached and removed in a piecemeal fashion and under the microscope, it was possible to see that a large piece of disc material was removed from beneath the  L 5 nerve root, displacing it medially and displacing the thecal sac medially.  Using painstaking microdissection, I was able to mobilize these neural elements and then removed additional  disc material which extended into the interspace.  I was able at this point to mobilize the nerve medially and to palpate along its course with ball hooks and confirm that there were no additional compressive disc fragments. The medial and lateral aspects of the interspace were further cleared of disc material.  The interspace was then irrigated with bacitracin saline and no additional disc material was mobilized. Hemostasis was assured with bipolar electrocautery and the interspace was irrigated with Depo-Medrol and fentanyl. The lumbodorsal fascia was closed with 0 Vicryl sutures the subcutaneous tissues reapproximated 2-0 Vicryl inverted sutures and the skin edges were reapproximated with 3-0 Vicryl subcuticular stitch. The wound is dressed with Dermabond. Patient was extubated in the operating room and taken to recovery on a bariatric bed in stable and satisfactory condition having tolerated his operation well. Counts were correct at the end of the case.  PLAN OF CARE: Admit for overnight observation  PATIENT DISPOSITION:  PACU - hemodynamically stable.   Delay start of Pharmacological VTE agent (>24hrs) due to surgical blood loss or risk of bleeding: yes

## 2012-02-21 NOTE — Preoperative (Signed)
Beta Blockers   Reason not to administer Beta Blockers:Not Applicable 

## 2012-02-21 NOTE — Anesthesia Postprocedure Evaluation (Signed)
  Anesthesia Post-op Note  Patient: Jeffrey Burton  Procedure(s) Performed: Procedure(s) (LRB) with comments: LUMBAR LAMINECTOMY/DECOMPRESSION MICRODISCECTOMY 1 LEVEL (Left) - Redo Left Lumbar four-five Microdiskectomy  Patient Location: PACU  Anesthesia Type:General  Level of Consciousness: awake and alert   Airway and Oxygen Therapy: Patient Spontanous Breathing  Post-op Pain: mild  Post-op Assessment: Post-op Vital signs reviewed, Patient's Cardiovascular Status Stable, Respiratory Function Stable, Patent Airway, No signs of Nausea or vomiting and Pain level controlled  Post-op Vital Signs: stable  Complications: No apparent anesthesia complications

## 2012-02-21 NOTE — Addendum Note (Signed)
Addendum  created 02/21/12 1039 by Tyrone Nine, CRNA   Modules edited:Charges VN

## 2012-02-21 NOTE — Progress Notes (Signed)
Awake, alert, conversant.  Full strength in both legs, pain resolved.  Doing well.

## 2012-02-21 NOTE — Discharge Summary (Signed)
Physician Discharge Summary  Patient ID: [name] MRN: [id number] DOB/AGE: December 05, 1965 47 y.o.  Admit date: 02/21/2012 Discharge date: 02/21/2012  Admission Diagnoses: Recurrent left lumbar four-five herniated nucleus pulposus without myelopathy, spondylosis, degnerative disc disease, radiculopathy    Discharge Diagnoses: Recurrent left lumbar four-five herniated nucleus pulposus without myelopathy, spondylosis, degnerative disc disease, radiculopathy s/p LUMBAR LAMINECTOMY/DECOMPRESSION MICRODISCECTOMY 1 LEVEL (Left) - Redo Left Lumbar four-five Microdiskectomy with microdissection   Active Problems:  * No active hospital problems. *    Discharged Condition: good  Hospital Course: Jeffrey Burton was admitted 02-21-12 for surgery with Dx of Recurrent left lumbar four-five herniated nucleus pulposus without myelopathy, spondylosis, degnerative disc disease, radiculopathy. Following uncomplicated redo L4-5 microdiscectomy, pt recovered in Neuro PACU & transferred to 3500 for observation. He has progressed steadily.     Consults: None  Significant Diagnostic Studies: radiology: X-Ray: intra-operative  Treatments: surgery: LUMBAR LAMINECTOMY/DECOMPRESSION MICRODISCECTOMY 1 LEVEL (Left) - Redo Left Lumbar four-five Microdiskectomy with microdissection   Discharge Exam: Blood pressure 137/88, pulse 61, temperature 97.9 F (36.6 C), temperature source Oral, resp. rate 18, SpO2 100.00%. Alert, conversant, without c/o leg or back pain. Incision with Dermabond; no erythema, swelling, or drainage. Good strength BLE. Ambulates w/o difficulty.     Disposition: Discharge to home - self care. Pt has pain medications for prn use at home. Pt & wife verbalize understanding of d/c instructions & agree to call office on Monday to schedule 3wk f/u appt with Dr. Venetia Maxon.       Medication List     As of 02/21/2012  4:19 PM    ASK your doctor about these medications        oxyCODONE-acetaminophen 5-325 MG per tablet   Commonly known as: PERCOCET/ROXICET   Take 1 tablet by mouth every 4 (four) hours as needed. For pain.         Signed: Georgiann Cocker 02/21/2012, 4:19 PM

## 2012-02-21 NOTE — Transfer of Care (Signed)
Immediate Anesthesia Transfer of Care Note  Patient: Jeffrey Burton  Procedure(s) Performed: Procedure(s) (LRB) with comments: LUMBAR LAMINECTOMY/DECOMPRESSION MICRODISCECTOMY 1 LEVEL (Left) - Redo Left Lumbar four-five Microdiskectomy  Patient Location: PACU  Anesthesia Type:General  Level of Consciousness: awake, alert , oriented and patient cooperative  Airway & Oxygen Therapy: Patient Spontanous Breathing and Patient connected to nasal cannula oxygen  Post-op Assessment: Report given to PACU RN and Post -op Vital signs reviewed and stable  Post vital signs: Reviewed and stable  Complications: No apparent anesthesia complications

## 2012-02-21 NOTE — Anesthesia Procedure Notes (Signed)
Procedure Name: Intubation Date/Time: 02/21/2012 7:43 AM Performed by: Tyrone Nine Pre-anesthesia Checklist: Patient identified, Emergency Drugs available, Suction available, Patient being monitored and Timeout performed Patient Re-evaluated:Patient Re-evaluated prior to inductionOxygen Delivery Method: Circle system utilized Preoxygenation: Pre-oxygenation with 100% oxygen Intubation Type: IV induction Ventilation: Mask ventilation without difficulty Laryngoscope Size: Mac and 3 Grade View: Grade I Tube type: Oral Number of attempts: 1 Airway Equipment and Method: Stylet Placement Confirmation: ETT inserted through vocal cords under direct vision,  positive ETCO2 and breath sounds checked- equal and bilateral Secured at: 22 cm Tube secured with: Tape Dental Injury: Teeth and Oropharynx as per pre-operative assessment

## 2012-02-24 ENCOUNTER — Encounter (HOSPITAL_COMMUNITY): Payer: Self-pay | Admitting: Neurosurgery

## 2012-02-25 NOTE — Discharge Summary (Signed)
Pain much improved

## 2012-12-03 ENCOUNTER — Other Ambulatory Visit: Payer: Self-pay

## 2013-08-20 ENCOUNTER — Encounter: Payer: Self-pay | Admitting: Internal Medicine

## 2013-08-20 ENCOUNTER — Ambulatory Visit (INDEPENDENT_AMBULATORY_CARE_PROVIDER_SITE_OTHER): Payer: 59 | Admitting: Internal Medicine

## 2013-08-20 VITALS — BP 136/80 | HR 58 | Wt 231.0 lb

## 2013-08-20 DIAGNOSIS — F411 Generalized anxiety disorder: Secondary | ICD-10-CM

## 2013-08-20 MED ORDER — ESCITALOPRAM OXALATE 10 MG PO TABS: 10.0000 mg | ORAL_TABLET | Freq: Every day | ORAL | Status: DC

## 2013-08-20 MED ORDER — CLONAZEPAM 0.5 MG PO TABS: 0.5000 mg | ORAL_TABLET | Freq: Every day | ORAL | Status: DC

## 2013-08-20 NOTE — Progress Notes (Signed)
   Subjective:    Patient ID: Jeffrey Burton, male    DOB: [birth year], 48 y.o.   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 On Sunday nights he begins to feel anxious and stressed about returning to work on Monday. Frequently wakes up at 3 AM and doesn't sleep well between 3 and 5:30 AM. Gets up to go to work at 5:30 AM. His job is requiring more and more tasks and he is feeling spread pretty thin. Putting in a lot of hours at work. Try to keep good relationships with coworkers. Mostly feeling anxious. Doesn't think he is depressed. Tries to do a good job and takes his job seriously. At times doesn't feel valued at work.    Review of Systems     Objective:   Physical Exam  Spent 25 minutes speaking with patient about  these issues. I have made recommendations for a counselor for him.      Assessment & Plan:  Anxiety  Plan: Lexapro 10 mg daily. Klonopin 0.5 mg at bedtime when necessary sleep. Return in 4 weeks.

## 2013-08-20 NOTE — Patient Instructions (Signed)
Start Lexapro 10 mg daily. Take Klonopin sparingly for anxiety. Return in 4 weeks.

## 2013-09-02 ENCOUNTER — Other Ambulatory Visit: Payer: Self-pay

## 2013-09-02 ENCOUNTER — Telehealth: Payer: Self-pay | Admitting: Internal Medicine

## 2013-09-02 MED ORDER — SERTRALINE HCL 50 MG PO TABS: 50.0000 mg | ORAL_TABLET | Freq: Every day | ORAL | Status: DC

## 2013-09-02 NOTE — Telephone Encounter (Signed)
Change to Zoloft 50 mg daily

## 2013-09-03 NOTE — Telephone Encounter (Signed)
Patient informed. Rx sent to [place]

## 2013-09-24 ENCOUNTER — Ambulatory Visit: Payer: 59 | Admitting: Internal Medicine

## 2013-11-12 ENCOUNTER — Other Ambulatory Visit: Payer: Self-pay

## 2014-03-23 ENCOUNTER — Encounter: Payer: Self-pay | Admitting: Emergency Medicine

## 2014-03-23 ENCOUNTER — Emergency Department (INDEPENDENT_AMBULATORY_CARE_PROVIDER_SITE_OTHER)
Admission: EM | Admit: 2014-03-23 | Discharge: 2014-03-23 | Disposition: A | Discharge status: Home / Self Care | Payer: 59 | Source: Home / Self Care | Attending: Family Medicine | Admitting: Family Medicine

## 2014-03-23 DIAGNOSIS — J209 Acute bronchitis, unspecified: Secondary | ICD-10-CM

## 2014-03-23 LAB — POCT RAPID STREP A (OFFICE): RAPID STREP A SCREEN: NEGATIVE

## 2014-03-23 MED ORDER — AZITHROMYCIN 250 MG PO TABS: ORAL_TABLET | ORAL | Status: DC

## 2014-03-23 MED ORDER — BENZONATATE 200 MG PO CAPS: 200.0000 mg | ORAL_CAPSULE | Freq: Every day | ORAL | Status: DC

## 2014-03-23 NOTE — ED Provider Notes (Signed)
CSN: [id number]     Arrival date & time 03/23/14  0160 History   First MD Initiated Contact with Patient 03/23/14 0845     Chief Complaint  Patient presents with  . Sore Throat      HPI Comments: Patient had been in [place] last week, returning two days ago.  Two days ago he developed a non-productive cough, headache, fatigue, and myalgias.  No sore throat or nasal congestion.  He does have soreness in his throat when he coughs.  He feels tightness in his anterior chest when he coughs.  The history is provided by the patient.    Past Medical History  Diagnosis Date  . No pertinent past medical history    Past Surgical History  Procedure Laterality Date  . Back surgery    . Knee surgery      left knee  . Lumbar laminectomy/decompression microdiscectomy  02/21/2012    Procedure: LUMBAR LAMINECTOMY/DECOMPRESSION MICRODISCECTOMY 1 LEVEL;  Surgeon: Erline Levine, MD;  Location: [facility];  Service: Neurosurgery;  Laterality: Left;  Redo Left Lumbar four-five Microdiskectomy   Family History  Problem Relation Age of Onset  . Hypertension Father    History  Substance Use Topics  . Smoking status: Never Smoker   . Smokeless tobacco: Not on file  . Alcohol Use: 0.0 oz/week     Comment: occasionally    Review of Systems + sore throat + cough No pleuritic pain, but has tightness in anterior chest No wheezing No nasal congestion No post-nasal drainage No sinus pain/pressure No itchy/red eyes No earache No hemoptysis No SOB No fever/chills No nausea No vomiting No abdominal pain No diarrhea No urinary symptoms No skin rash + fatigue + myalgias No headache Used OTC meds without relief  Allergies  Review of patient's allergies indicates not on file.  Home Medications   Prior to Admission medications   Medication Sig Start Date End Date Taking? Authorizing Provider  azithromycin (ZITHROMAX Z-PAK) 250 MG tablet Take 2 tabs today; then begin one tab once daily for  4 more days. 03/23/14   Kandra Nicolas, MD  benzonatate (TESSALON) 200 MG capsule Take 1 capsule (200 mg total) by mouth at bedtime. Take as needed for cough 03/23/14   Kandra Nicolas, MD  clonazePAM (KLONOPIN) 0.5 MG tablet Take 1 tablet (0.5 mg total) by mouth at bedtime. 08/20/13   Elby Showers, MD  sertraline (ZOLOFT) 50 MG tablet Take 1 tablet (50 mg total) by mouth daily. 09/02/13   Elby Showers, MD   BP 158/92 mmHg  Pulse 62  Temp(Src) 98.1 F (36.7 C) (Oral)  Ht 6' (1.829 m)  Wt 242 lb (109.77 kg)  BMI 32.81 kg/m2  SpO2 98% Physical Exam Nursing notes and Vital Signs reviewed. Appearance:  Patient appears stated age, and in no acute distress Eyes:  Pupils are equal, round, and reactive to light and accomodation.  Extraocular movement is intact.  Conjunctivae are not inflamed  Ears:  Canals normal.  Tympanic membranes normal.  Nose:   Normal turbinates.  No sinus tenderness.   Pharynx:  Normal Neck:  Supple.  Tender enlarged posterior nodes are palpated bilaterally  Lungs:  Clear to auscultation.  Breath sounds are equal.  Heart:  Regular rate and rhythm without murmurs, rubs, or gallops.  Abdomen:  Nontender without masses or hepatosplenomegaly.  Bowel sounds are present.  No CVA or flank tenderness.  Extremities:  No edema.  No calf tenderness Skin:  No  rash present.   ED Course  Procedures  None    Labs Reviewed  POCT RAPID STREP A (OFFICE) negative         MDM   1. Acute bronchitis, unspecified organism    Begin Z-pack for atypical coverage.  Prescription written for Benzonatate [facility]) to take at bedtime for night-time cough.  Take plain guaifenesin 1200mg  (Mucinex) twice daily, with plenty of water, for cough and congestion.  May add Pseudoephedrine for sinus congestion if needed.  Get adequate rest.   Stop all antihistamines for now, and other non-prescription cough/cold preparations. May take Ibuprofen 200mg , 4 tabs every 8 hours with food for  chest/sternum discomfort.   Follow-up with family doctor if not improving about one week.     Kandra Nicolas, MD 03/23/14 1024

## 2014-03-23 NOTE — Discharge Instructions (Signed)
Take plain guaifenesin 1200mg  (Mucinex) twice daily, with plenty of water, for cough and congestion.  May add Pseudoephedrine for sinus congestion if needed.  Get adequate rest.   Stop all antihistamines for now, and other non-prescription cough/cold preparations. May take Ibuprofen 200mg , 4 tabs every 8 hours with food for chest/sternum discomfort.   Follow-up with family doctor if not improving about one week.

## 2014-03-23 NOTE — ED Notes (Signed)
Sore throat. Cough, yellow mucus, congestion x 4 days

## 2015-09-14 ENCOUNTER — Encounter: Payer: Self-pay | Admitting: Internal Medicine

## 2015-09-14 ENCOUNTER — Ambulatory Visit (INDEPENDENT_AMBULATORY_CARE_PROVIDER_SITE_OTHER): Payer: 59 | Admitting: Internal Medicine

## 2015-09-14 ENCOUNTER — Telehealth: Payer: Self-pay | Admitting: Internal Medicine

## 2015-09-14 VITALS — BP 138/82 | HR 53 | Temp 97.6°F | Ht 72.0 in | Wt 237.5 lb

## 2015-09-14 DIAGNOSIS — H524 Presbyopia: Secondary | ICD-10-CM | POA: Diagnosis not present

## 2015-09-14 DIAGNOSIS — H109 Unspecified conjunctivitis: Secondary | ICD-10-CM | POA: Diagnosis not present

## 2015-09-14 MED ORDER — OFLOXACIN 0.3 % OP SOLN: 2.0000 [drp] | Freq: Four times a day (QID) | OPHTHALMIC | Status: DC

## 2015-09-14 NOTE — Telephone Encounter (Signed)
Patient states that he has some issues with his right eye.  States this is NOT eye pain and it is more annoying than anything.  It is underneath the bottom of the eye on the outside.  States it is red, but there is no drainage.  No pain, no swelling, just red.  Feels that there's something there, almost like he can see something out of his eyeball; states it has been like this for the past month to 6 weeks.    When asked if he had had an eye exam lately, patient states he doesn't have an eye doctor.  Can you recommend an opthamologist/optometrist for him?  States he hasn't had his eyes checked in years and he IS getting older.  He does have trouble seeing at night when he is driving.    Advised that you may want to just refer him directly to an eye doctor.   Please advise.

## 2015-09-14 NOTE — Telephone Encounter (Signed)
Coming at 4:30 today

## 2015-09-15 ENCOUNTER — Telehealth: Payer: Self-pay | Admitting: Internal Medicine

## 2015-09-15 DIAGNOSIS — H52222 Regular astigmatism, left eye: Secondary | ICD-10-CM | POA: Diagnosis not present

## 2015-09-15 DIAGNOSIS — H524 Presbyopia: Secondary | ICD-10-CM | POA: Diagnosis not present

## 2015-09-15 DIAGNOSIS — H5212 Myopia, left eye: Secondary | ICD-10-CM | POA: Diagnosis not present

## 2015-09-15 DIAGNOSIS — H5211 Myopia, right eye: Secondary | ICD-10-CM | POA: Diagnosis not present

## 2015-09-15 MED ORDER — OFLOXACIN 0.3 % OP SOLN: 2.0000 [drp] | Freq: Four times a day (QID) | OPHTHALMIC | 0 refills | Status: DC

## 2015-09-15 NOTE — Patient Instructions (Signed)
We will arrange for you to have ophthalmology exam in the very near future. Ocuflox ophthalmic solution 2 drops in right eye 4 times a day for 5-7 days.

## 2015-09-15 NOTE — Telephone Encounter (Signed)
Spoke with [facility]; appointment made with Dr. Posey Pronto for Monday, 8/21 @ 8:30 a.m.  Eye Exam and f/u on ? Conjunctivitis.    Spoke with patient to make him aware of this appointment.  Provided him with address and phone number of facility.  Patient advised that he wasn't able to get his medication on last evening.  It wasn't at [facility] when [facility] got there.   Cass[facility] to [facility].  Patient will go and pick up Rx today.

## 2015-09-15 NOTE — Progress Notes (Signed)
   Subjective:    Patient ID: Jeffrey Burton, male    DOB: [birth year], 50 y.o.   MRN: [id number]  HPI Pleasant 50 year old White Male, chief vascular sonographer at [employer] whose general health is good. He presents today with a 4 week history of right eye irritation. Denies any pus or drainage from the. Has not seen any stye-like lesion. Says it feels irritated as if something is in it. He does do some woodworking from time to time. Not sure if there could possibly be a tiny foreign body in his eye.  He's not had a good eye exam in some time and feels that his eyes are changing. Needs to have good eye exam by ophthalmologist.  He had lumbar disc surgery by Dr. Vertell Limber January 2014 for herniated disc L4-L5.  Had bronchitis after trip to [place] February 2016 seen at urgent care.  Work is going well for him right now.  Fractured clavicle in the remote past.  No known drug allergies.  Married and completed 2 years of college.  Does not smoke. Social alcohol consumption.  No chronic medications.  Family history: Father with history of hypertension and obesity. Mother died of metastatic breast cancer. One brother whose state of health is unknown to patient.    Review of Systems see above     Objective:   Physical Exam  Right eye conjunctiva very red. No drainage noted. Extraocular movements are full. PERRLA. No hordeolum.      Assessment & Plan:  Conjunctivitis right eye  Probable presbyopia  There is a possibility he could have a tiny foreign body from woodworking  Plan: He will have eye exam in the very near future. I prescribed Ocuflox ophthalmic solution 2 drops in right eye 4 times daily for 5-7 days.

## 2016-08-04 ENCOUNTER — Telehealth: Payer: 59 | Admitting: Family

## 2016-08-04 DIAGNOSIS — J329 Chronic sinusitis, unspecified: Secondary | ICD-10-CM | POA: Diagnosis not present

## 2016-08-04 DIAGNOSIS — B9789 Other viral agents as the cause of diseases classified elsewhere: Secondary | ICD-10-CM

## 2016-08-04 MED ORDER — FLUTICASONE PROPIONATE 50 MCG/ACT NA SUSP: 2.0000 | Freq: Every day | NASAL | 6 refills | Status: DC

## 2016-08-04 NOTE — Progress Notes (Signed)
We are sorry that you are not feeling well.  Here is how we plan to help!  Based on what you have shared with me it looks like you have sinusitis.  Sinusitis is inflammation and infection in the sinus cavities of the head.  Based on your presentation I believe you most likely have Acute Viral Sinusitis.This is an infection most likely caused by a virus. There is not specific treatment for viral sinusitis other than to help you with the symptoms until the infection runs its course.  You may use an oral decongestant such as Mucinex D or if you have glaucoma or high blood pressure use plain Mucinex. Saline nasal spray help and can safely be used as often as needed for congestion, I have prescribed: Fluticasone nasal spray two sprays in each nostril twice a day   Providers prescribe antibiotics to treat infections caused by bacteria. Antibiotics are very powerful in treating bacterial infections when they are used properly. To maintain their effectiveness, they should be used only when necessary. Overuse of antibiotics has resulted in the development of superbugs that are resistant to treatment!    After careful review of your answers, I would not recommend an antibiotic for your condition.  Antibiotics are not effective against viruses and therefore should not be used to treat them. Common examples of infections caused by viruses include colds and flu   Some authorities believe that zinc sprays or the use of Echinacea may shorten the course of your symptoms.  Sinus infections are not as easily transmitted as other respiratory infection, however we still recommend that you avoid close contact with loved ones, especially the very young and elderly.  Remember to wash your hands thoroughly throughout the day as this is the number one way to prevent the spread of infection!  Home Care:  Only take medications as instructed by your medical team.  Complete the entire course of an antibiotic.  Do not take  these medications with alcohol.  A steam or ultrasonic humidifier can help congestion.  You can place a towel over your head and breathe in the steam from hot water coming from a faucet.  Avoid close contacts especially the very young and the elderly.  Cover your mouth when you cough or sneeze.  Always remember to wash your hands.  Get Help Right Away If:  You develop worsening fever or sinus pain.  You develop a severe head ache or visual changes.  Your symptoms persist after you have completed your treatment plan.  Make sure you  Understand these instructions.  Will watch your condition.  Will get help right away if you are not doing well or get worse.  Your e-visit answers were reviewed by a board certified advanced clinical practitioner to complete your personal care plan.  Depending on the condition, your plan could have included both over the counter or prescription medications.  If there is a problem please reply  once you have received a response from your provider.  Your safety is important to us.  If you have drug allergies check your prescription carefully.    You can use MyChart to ask questions about today's visit, request a non-urgent call back, or ask for a work or school excuse for 24 hours related to this e-Visit. If it has been greater than 24 hours you will need to follow up with your provider, or enter a new e-Visit to address those concerns.  You will get an e-mail in the next two   days asking about your experience.  I hope that your e-visit has been valuable and will speed your recovery. Thank you for using e-visits.    

## 2016-09-17 DIAGNOSIS — H52222 Regular astigmatism, left eye: Secondary | ICD-10-CM | POA: Diagnosis not present

## 2016-09-17 DIAGNOSIS — H5212 Myopia, left eye: Secondary | ICD-10-CM | POA: Diagnosis not present

## 2016-09-17 DIAGNOSIS — H5211 Myopia, right eye: Secondary | ICD-10-CM | POA: Diagnosis not present

## 2016-09-17 DIAGNOSIS — H524 Presbyopia: Secondary | ICD-10-CM | POA: Diagnosis not present

## 2017-02-17 ENCOUNTER — Other Ambulatory Visit: Payer: Self-pay | Admitting: Internal Medicine

## 2017-02-17 DIAGNOSIS — Z13220 Encounter for screening for lipoid disorders: Secondary | ICD-10-CM

## 2017-02-17 DIAGNOSIS — Z125 Encounter for screening for malignant neoplasm of prostate: Secondary | ICD-10-CM

## 2017-02-17 DIAGNOSIS — Z0000 Encounter for general adult medical examination without abnormal findings: Secondary | ICD-10-CM

## 2017-03-21 ENCOUNTER — Other Ambulatory Visit: Payer: No Typology Code available for payment source | Admitting: Internal Medicine

## 2017-03-21 DIAGNOSIS — Z13220 Encounter for screening for lipoid disorders: Secondary | ICD-10-CM

## 2017-03-21 DIAGNOSIS — Z0000 Encounter for general adult medical examination without abnormal findings: Secondary | ICD-10-CM

## 2017-03-21 DIAGNOSIS — Z125 Encounter for screening for malignant neoplasm of prostate: Secondary | ICD-10-CM

## 2017-03-21 LAB — COMPLETE METABOLIC PANEL WITH GFR
AG Ratio: 2 (calc) (ref 1.0–2.5)
ALT: 33 U/L (ref 9–46)
AST: 26 U/L (ref 10–35)
Albumin: 4.6 g/dL (ref 3.6–5.1)
Alkaline phosphatase (APISO): 62 U/L (ref 40–115)
BUN: 16 mg/dL (ref 7–25)
CALCIUM: 9.7 mg/dL (ref 8.6–10.3)
CO2: 25 mmol/L (ref 20–32)
Chloride: 107 mmol/L (ref 98–110)
Creat: 1.03 mg/dL (ref 0.70–1.33)
GFR, EST NON AFRICAN AMERICAN: 83 mL/min/{1.73_m2} (ref >=60)
GFR, Est African American: 96 mL/min/{1.73_m2} (ref >=60)
GLOBULIN: 2.3 g/dL (ref 1.9–3.7)
GLUCOSE: 98 mg/dL (ref 65–99)
Potassium: 5.1 mmol/L (ref 3.5–5.3)
SODIUM: 142 mmol/L (ref 135–146)
Total Bilirubin: 0.7 mg/dL (ref 0.2–1.2)
Total Protein: 6.9 g/dL (ref 6.1–8.1)

## 2017-03-21 LAB — LIPID PANEL
CHOL/HDL RATIO: 3.5 (calc) (ref <5.0)
CHOLESTEROL: 235 mg/dL — AB (ref <200)
HDL: 68 mg/dL (ref >40)
LDL Cholesterol (Calc): 152 mg/dL (calc) — ABNORMAL HIGH
Non-HDL Cholesterol (Calc): 167 mg/dL (calc) — ABNORMAL HIGH (ref <130)
Triglycerides: 54 mg/dL (ref <150)

## 2017-03-21 LAB — CBC WITH DIFFERENTIAL/PLATELET
BASOS PCT: 0.9 %
Basophils Absolute: 48 cells/uL (ref 0–200)
EOS ABS: 37 {cells}/uL (ref 15–500)
Eosinophils Relative: 0.7 %
HEMATOCRIT: 46.7 % (ref 38.5–50.0)
HEMOGLOBIN: 15.6 g/dL (ref 13.2–17.1)
LYMPHS ABS: 2094 {cells}/uL (ref 850–3900)
MCH: 28.8 pg (ref 27.0–33.0)
MCHC: 33.4 g/dL (ref 32.0–36.0)
MCV: 86.3 fL (ref 80.0–100.0)
MPV: 10.2 fL (ref 7.5–12.5)
Monocytes Relative: 10.5 %
NEUTROS ABS: 2565 {cells}/uL (ref 1500–7800)
Neutrophils Relative %: 48.4 %
PLATELETS: 255 10*3/uL (ref 140–400)
RBC: 5.41 10*6/uL (ref 4.20–5.80)
RDW: 12.7 % (ref 11.0–15.0)
TOTAL LYMPHOCYTE: 39.5 %
WBC: 5.3 10*3/uL (ref 3.8–10.8)
WBCMIX: 557 {cells}/uL (ref 200–950)

## 2017-03-21 LAB — PSA: PSA: 1.2 ng/mL (ref <=4.0)

## 2017-03-25 ENCOUNTER — Encounter: Payer: 59 | Admitting: Internal Medicine

## 2017-03-27 ENCOUNTER — Ambulatory Visit (INDEPENDENT_AMBULATORY_CARE_PROVIDER_SITE_OTHER): Payer: No Typology Code available for payment source | Admitting: Internal Medicine

## 2017-03-27 ENCOUNTER — Encounter: Payer: Self-pay | Admitting: Internal Medicine

## 2017-03-27 VITALS — BP 138/70 | HR 76 | Ht 72.0 in | Wt 235.1 lb

## 2017-03-27 DIAGNOSIS — R5383 Other fatigue: Secondary | ICD-10-CM

## 2017-03-27 DIAGNOSIS — E7800 Pure hypercholesterolemia, unspecified: Secondary | ICD-10-CM | POA: Diagnosis not present

## 2017-03-27 DIAGNOSIS — Z0000 Encounter for general adult medical examination without abnormal findings: Secondary | ICD-10-CM

## 2017-03-27 LAB — POCT URINALYSIS DIPSTICK
Appearance: NORMAL
Bilirubin, UA: NEGATIVE
Blood, UA: NEGATIVE
Glucose, UA: NEGATIVE
KETONES UA: NEGATIVE
Leukocytes, UA: NEGATIVE
NITRITE UA: NEGATIVE
ODOR: NORMAL
PH UA: 6.5 (ref 5.0–8.0)
PROTEIN UA: NEGATIVE
SPEC GRAV UA: 1.01 (ref 1.010–1.025)
UROBILINOGEN UA: 0.2 U/dL

## 2017-03-27 LAB — T4, FREE: Free T4: 1.5 ng/dL (ref 0.8–1.8)

## 2017-03-27 LAB — TSH: TSH: 0.44 mIU/L (ref 0.40–4.50)

## 2017-03-27 MED ORDER — ROSUVASTATIN CALCIUM 5 MG PO TABS: 5.0000 mg | ORAL_TABLET | Freq: Every day | ORAL | 0 refills | Status: DC

## 2017-03-27 NOTE — Progress Notes (Signed)
Subjective:    Patient ID: Jeffrey Burton, male    DOB: 1965-11-13, 52 y.o.   MRN: [id number]  HPI 52 year old Male for health maintenance exam and evaluation of medical issues.  He had a visit on February 9 for flulike illness.  He was sick for about a week with malaise fatigue fever and chills.  He improved.  He did have flu vaccine through employment.  We last saw him in 2017.  His general health has been good.  He had a lumbar procedure at [facility] in [place] that sounds like a microdiscectomy.  He thinks it may have been at the L4-L5 level but cannot remember.  These records are not on Care Everywhere.  He subsequently had left  lower extremity radiculopathy in 2013.  This was evaluated with MRI and he was found to have leftward disc extrusion L4-L5.  He had surgery by Dr. Vertell Limber and is done well since that time.  Social history: He is married.  Completed 2 years of college.  He is a native of [place] and formerly served in the [employer] in [place].  He moved to [facility] to work in [employer] and then moved to [place].  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  History of fractured clavicle in the remote past.  No chronic medications.  No known drug allergies.  Family history: Father with history of hyperlipidemia and hypertension living at age 81.  Mother died of metastatic breast cancer.  One brother age 73 with history of hypertension and hyperlipidemia.  Son overweight has lost about 75 pounds recently also has issues with blood pressure and hyperlipidemia.        Review of Systems  Constitutional: Negative.   Respiratory: Negative.   Cardiovascular: Negative.   Gastrointestinal: Negative.   Genitourinary: Negative.   Neurological: Negative.   Psychiatric/Behavioral: Negative.    Recent bout of the flu with myalgias and subsequently cough and congestion that resolved without antibiotics  Recommend  screening colonoscopy    Objective:   Physical Exam  Constitutional: He is oriented to person, place, and time. He appears well-developed and well-nourished. No distress.  HENT:  Head: Normocephalic and atraumatic.  Right Ear: External ear normal.  Left Ear: External ear normal.  Mouth/Throat: Oropharynx is clear and moist.  Eyes: Conjunctivae and EOM are normal. Pupils are equal, round, and reactive to light. Right eye exhibits no discharge. Left eye exhibits no discharge.  Neck: No JVD present. No thyromegaly present.  Cardiovascular: Normal rate, regular rhythm and normal heart sounds.  No murmur heard. Pulmonary/Chest: Effort normal and breath sounds normal. No respiratory distress. He has no wheezes. He has no rales.  Abdominal: Soft. Bowel sounds are normal. He exhibits no distension and no mass. There is no tenderness. There is no rebound and no guarding.  Genitourinary: Prostate normal.  Musculoskeletal: He exhibits no edema.  Lymphadenopathy:    He has no cervical adenopathy.  Neurological: He is alert and oriented to person, place, and time. He has normal reflexes. No cranial nerve deficit.  Skin: Skin is warm and dry. No rash noted. He is not diaphoretic.  Psychiatric: He has a normal mood and affect. His behavior is normal. Judgment and thought content normal.  Vitals reviewed.         Assessment & Plan:  Start Crestor 5 mg daily and follow-up in 3 months with lipid panel and liver functions without  office visit.  Total cholesterol was 235 with an LDL cholesterol of 152.  He has a high HDL cholesterol of 68.  Triglycerides are normal at 54.  Were going to start Crestor 5 mg daily.  Thyroid functions checked because he was told recently he had calcifications of his thyroid bilaterally on ultrasound by another technician.  His blood pressure is stable at 138/70.  BMI is 31.88 I would like to see him lose 20 pounds.  He is to check with employee health to see when his  last tetanus immunization was given.  Plan: In 3 months he will have lipid panel liver functions without office visit on Crestor.

## 2017-03-27 NOTE — Patient Instructions (Signed)
Start Crestor 5 mg daily.  In 3 months have lipid panel and liver functions without office visit.  Thyroid functions checked today because he was told he had calcified thyroid on ultrasound recently.

## 2017-03-31 ENCOUNTER — Telehealth: Payer: Self-pay | Admitting: Internal Medicine

## 2017-03-31 MED ORDER — AZITHROMYCIN 250 MG PO TABS: ORAL_TABLET | ORAL | 0 refills | Status: DC

## 2017-03-31 NOTE — Telephone Encounter (Signed)
Please call in a Z pak for him

## 2017-03-31 NOTE — Telephone Encounter (Signed)
ESCRIBED

## 2017-03-31 NOTE — Telephone Encounter (Signed)
Called patient with his lab results and he states that he is sick with what seems like a sinus infection.  Since he was just here, he is wondering if you would be willing to call him something in.  He said he is just miserable.  Has a terrible headache, sinuses hurting, sinus pressure.  No fever.  Sore throat.  Only productive cough in the morning, really nothing to speak of during the day, but states that he feels miserable because of the headache and sinus pressure.  Says that he has already had the flu this year and he doesn't feel like that..he just feels like he has a terrible sinus infection.  And, he has tried all of the OTC mess and he just can't get the headache to go away and the pressure to stop.    Pharmacy:  [facility] [address]  Thank you.   Phone #:  [phone]

## 2017-03-31 NOTE — Telephone Encounter (Signed)
I will call patient to let him know if you will send the medication to his pharmacy.  Thanks.

## 2017-06-24 ENCOUNTER — Other Ambulatory Visit: Payer: Self-pay | Admitting: Internal Medicine

## 2017-06-24 DIAGNOSIS — E7800 Pure hypercholesterolemia, unspecified: Secondary | ICD-10-CM

## 2017-06-24 DIAGNOSIS — Z79899 Other long term (current) drug therapy: Secondary | ICD-10-CM

## 2017-06-24 DIAGNOSIS — Z5181 Encounter for therapeutic drug level monitoring: Secondary | ICD-10-CM

## 2017-07-01 ENCOUNTER — Other Ambulatory Visit: Payer: No Typology Code available for payment source | Admitting: Internal Medicine

## 2017-07-01 DIAGNOSIS — Z5181 Encounter for therapeutic drug level monitoring: Secondary | ICD-10-CM

## 2017-07-01 DIAGNOSIS — E7800 Pure hypercholesterolemia, unspecified: Secondary | ICD-10-CM

## 2017-07-01 DIAGNOSIS — Z79899 Other long term (current) drug therapy: Secondary | ICD-10-CM

## 2017-07-01 LAB — HEPATIC FUNCTION PANEL
AG RATIO: 1.8 (calc) (ref 1.0–2.5)
ALBUMIN MSPROF: 4.5 g/dL (ref 3.6–5.1)
ALT: 26 U/L (ref 9–46)
AST: 20 U/L (ref 10–35)
Alkaline phosphatase (APISO): 57 U/L (ref 40–115)
BILIRUBIN DIRECT: 0.1 mg/dL (ref 0.0–0.2)
BILIRUBIN TOTAL: 0.8 mg/dL (ref 0.2–1.2)
GLOBULIN: 2.5 g/dL (ref 1.9–3.7)
Indirect Bilirubin: 0.7 mg/dL (calc) (ref 0.2–1.2)
Total Protein: 7 g/dL (ref 6.1–8.1)

## 2017-07-01 LAB — LIPID PANEL
CHOLESTEROL: 215 mg/dL — AB (ref <200)
HDL: 68 mg/dL (ref >40)
LDL Cholesterol (Calc): 128 mg/dL (calc) — ABNORMAL HIGH
Non-HDL Cholesterol (Calc): 147 mg/dL (calc) — ABNORMAL HIGH (ref <130)
Total CHOL/HDL Ratio: 3.2 (calc) (ref <5.0)
Triglycerides: 88 mg/dL (ref <150)

## 2017-07-03 ENCOUNTER — Telehealth: Payer: Self-pay | Admitting: Internal Medicine

## 2017-07-03 MED ORDER — ROSUVASTATIN CALCIUM 10 MG PO TABS: 10.0000 mg | ORAL_TABLET | Freq: Every day | ORAL | 0 refills | Status: DC

## 2017-07-03 NOTE — Telephone Encounter (Signed)
Escribed

## 2017-07-03 NOTE — Telephone Encounter (Signed)
Following lab work, Dr. Renold Genta is increasing patient's Crestor to 10mg  daily.  Would you please send new Rx to patient's pharmacy.  Left patient message that we are increasing his medication.  So, you don't have to call him, just send medication to his pharmacy.  He's returning in September for additional lab work.  So, he'll just need refill until 9/5.    Thanks so much.

## 2017-09-10 ENCOUNTER — Ambulatory Visit (HOSPITAL_COMMUNITY)
Admission: RE | Admit: 2017-09-10 | Discharge: 2017-09-10 | Disposition: A | Discharge status: Home / Self Care | Payer: No Typology Code available for payment source | Source: Ambulatory Visit | Attending: Internal Medicine | Admitting: Internal Medicine

## 2017-09-10 ENCOUNTER — Other Ambulatory Visit: Payer: Self-pay | Admitting: Internal Medicine

## 2017-09-10 DIAGNOSIS — M79671 Pain in right foot: Secondary | ICD-10-CM | POA: Insufficient documentation

## 2017-09-12 ENCOUNTER — Ambulatory Visit: Payer: No Typology Code available for payment source | Admitting: Internal Medicine

## 2017-09-25 ENCOUNTER — Encounter: Payer: Self-pay | Admitting: Internal Medicine

## 2017-09-25 ENCOUNTER — Ambulatory Visit (INDEPENDENT_AMBULATORY_CARE_PROVIDER_SITE_OTHER): Payer: No Typology Code available for payment source | Admitting: Internal Medicine

## 2017-09-25 VITALS — BP 120/70 | HR 50 | Temp 98.2°F | Ht 72.0 in | Wt 246.0 lb

## 2017-09-25 DIAGNOSIS — G5761 Lesion of plantar nerve, right lower limb: Secondary | ICD-10-CM | POA: Diagnosis not present

## 2017-09-25 MED ORDER — HYDROCODONE-ACETAMINOPHEN 5-325 MG PO TABS: 1.0000 | ORAL_TABLET | ORAL | 0 refills | Status: DC | PRN

## 2017-09-25 NOTE — Patient Instructions (Signed)
Norco 5/325 sparingly every 4-6 hours as needed pain.  Ibuprofen 800 mg at least twice daily.  Appointment with Dr. Noemi Chapel arranged for this coming Tuesday

## 2017-09-25 NOTE — Progress Notes (Signed)
   Subjective:    Patient ID: Jeffrey Burton, male    DOB: October 24, 1965, 52 y.o.   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  It turned black and blue on the dorsal aspect of his foot.  This was sometime in July.  He does a lot of walking at the hospital and recently had to start parking over near the outpatient pharmacy due to work being done on the employee parking deck.  His foot has continued to hurt.  Now hurts on the plantar aspect.  We recently did an x-ray that showed no evidence of fractures.    Review of Systems see above     Objective:   Physical Exam He is not tender on the dorsal aspect of his right foot.  There is no redness or swelling and no evidence of discoloration or bruising.  However on the plantar aspect of his foot between the third and fourth distal metatarsals, he has exquisite point tenderness.       Assessment & Plan:  Suspect Morton's neuroma of right foot  Plan: He will be referred to orthopedist for further evaluation.  Offered to give him a temporary handicap parking permit since he is having some issues with pain.  I have given him a 5-day supply of hydrocodone APAP.  Would like for him to take 800 mg of ibuprofen at least twice daily.

## 2018-04-21 ENCOUNTER — Other Ambulatory Visit: Payer: Self-pay

## 2018-04-21 MED ORDER — CLONAZEPAM 0.5 MG PO TABS: 0.5000 mg | ORAL_TABLET | Freq: Every day | ORAL | 1 refills | Status: DC

## 2018-04-21 NOTE — Telephone Encounter (Signed)
Patient called would like to get a refill on Klonopin, he said with everything going with the virus he is very stressed at work and can't sleep at night.

## 2018-11-02 ENCOUNTER — Other Ambulatory Visit: Payer: Self-pay

## 2018-11-02 ENCOUNTER — Other Ambulatory Visit: Payer: No Typology Code available for payment source | Admitting: Internal Medicine

## 2018-11-03 ENCOUNTER — Other Ambulatory Visit: Payer: No Typology Code available for payment source | Admitting: Internal Medicine

## 2018-11-03 DIAGNOSIS — Z125 Encounter for screening for malignant neoplasm of prostate: Secondary | ICD-10-CM

## 2018-11-03 DIAGNOSIS — E7800 Pure hypercholesterolemia, unspecified: Secondary | ICD-10-CM

## 2018-11-03 DIAGNOSIS — F419 Anxiety disorder, unspecified: Secondary | ICD-10-CM

## 2018-11-03 DIAGNOSIS — Z0000 Encounter for general adult medical examination without abnormal findings: Secondary | ICD-10-CM

## 2018-11-04 LAB — CBC WITH DIFFERENTIAL/PLATELET
Absolute Monocytes: 608 cells/uL (ref 200–950)
Basophils Absolute: 42 cells/uL (ref 0–200)
Basophils Relative: 0.8 %
Eosinophils Absolute: 52 cells/uL (ref 15–500)
Eosinophils Relative: 1 %
HCT: 45.4 % (ref 38.5–50.0)
Hemoglobin: 15.3 g/dL (ref 13.2–17.1)
Lymphs Abs: 2049 cells/uL (ref 850–3900)
MCH: 29.3 pg (ref 27.0–33.0)
MCHC: 33.7 g/dL (ref 32.0–36.0)
MCV: 87 fL (ref 80.0–100.0)
MPV: 10.4 fL (ref 7.5–12.5)
Monocytes Relative: 11.7 %
Neutro Abs: 2449 cells/uL (ref 1500–7800)
Neutrophils Relative %: 47.1 %
Platelets: 225 10*3/uL (ref 140–400)
RBC: 5.22 10*6/uL (ref 4.20–5.80)
RDW: 12.7 % (ref 11.0–15.0)
Total Lymphocyte: 39.4 %
WBC: 5.2 10*3/uL (ref 3.8–10.8)

## 2018-11-04 LAB — COMPREHENSIVE METABOLIC PANEL
AG Ratio: 1.9 (calc) (ref 1.0–2.5)
ALT: 40 U/L (ref 9–46)
AST: 25 U/L (ref 10–35)
Albumin: 4.4 g/dL (ref 3.6–5.1)
Alkaline phosphatase (APISO): 55 U/L (ref 35–144)
BUN: 9 mg/dL (ref 7–25)
CO2: 26 mmol/L (ref 20–32)
Calcium: 9.6 mg/dL (ref 8.6–10.3)
Chloride: 102 mmol/L (ref 98–110)
Creat: 0.94 mg/dL (ref 0.70–1.33)
Globulin: 2.3 g/dL (calc) (ref 1.9–3.7)
Glucose, Bld: 98 mg/dL (ref 65–99)
Potassium: 4.7 mmol/L (ref 3.5–5.3)
Sodium: 136 mmol/L (ref 135–146)
Total Bilirubin: 1 mg/dL (ref 0.2–1.2)
Total Protein: 6.7 g/dL (ref 6.1–8.1)

## 2018-11-04 LAB — LIPID PANEL
Cholesterol: 210 mg/dL — ABNORMAL HIGH (ref <200)
HDL: 68 mg/dL (ref >=40)
LDL Cholesterol (Calc): 124 mg/dL (calc) — ABNORMAL HIGH
Non-HDL Cholesterol (Calc): 142 mg/dL (calc) — ABNORMAL HIGH (ref <130)
Total CHOL/HDL Ratio: 3.1 (calc) (ref <5.0)
Triglycerides: 81 mg/dL (ref <150)

## 2018-11-04 LAB — PSA: PSA: 4.7 ng/mL — ABNORMAL HIGH (ref <=4.0)

## 2018-11-09 ENCOUNTER — Encounter: Payer: No Typology Code available for payment source | Admitting: Internal Medicine

## 2018-11-20 ENCOUNTER — Telehealth: Payer: Self-pay | Admitting: Internal Medicine

## 2018-11-20 NOTE — Telephone Encounter (Signed)
Scheduled appointment

## 2018-11-20 NOTE — Telephone Encounter (Signed)
Schedule OV next week 

## 2018-11-20 NOTE — Telephone Encounter (Signed)
Please advise patient has CPE scheduled in January does he need to come in to discuss?

## 2018-11-20 NOTE — Telephone Encounter (Signed)
Jeffrey Burton [phone]  Jeffrey Burton called to say he seen his lab results in his MyChart and is asking if he should be concerned about his PSA levels?

## 2018-11-24 ENCOUNTER — Other Ambulatory Visit: Payer: Self-pay

## 2018-11-24 ENCOUNTER — Encounter: Payer: Self-pay | Admitting: Internal Medicine

## 2018-11-24 ENCOUNTER — Ambulatory Visit (INDEPENDENT_AMBULATORY_CARE_PROVIDER_SITE_OTHER): Payer: No Typology Code available for payment source | Admitting: Internal Medicine

## 2018-11-24 VITALS — BP 120/80 | HR 55 | Temp 98.0°F | Ht 72.0 in | Wt 244.0 lb

## 2018-11-24 DIAGNOSIS — Z566 Other physical and mental strain related to work: Secondary | ICD-10-CM

## 2018-11-24 DIAGNOSIS — N410 Acute prostatitis: Secondary | ICD-10-CM

## 2018-11-24 DIAGNOSIS — R972 Elevated prostate specific antigen [PSA]: Secondary | ICD-10-CM | POA: Diagnosis not present

## 2018-11-24 DIAGNOSIS — E7800 Pure hypercholesterolemia, unspecified: Secondary | ICD-10-CM

## 2018-11-24 MED ORDER — DOXYCYCLINE HYCLATE 100 MG PO TABS: 100.0000 mg | ORAL_TABLET | Freq: Two times a day (BID) | ORAL | 0 refills | Status: DC

## 2018-11-24 NOTE — Patient Instructions (Signed)
Take doxycycline 100 mg twice daily for 10 days and follow-up here for physical exam and repeat PSA in late November.

## 2018-11-24 NOTE — Progress Notes (Signed)
   Subjective:    Patient ID: Jeffrey Burton, male    DOB: [birth year], 53 y.o.   MRN: [id number]  HPI Due to work issues, he had to postpone his physical at examination but had fasting lab work showing an elevated PSA of 4.7 and previously had been 1.2 last year.  He was concerned to find the elevated PSA but has no symptoms of dysuria urinary frequency or urinary hesitancy.  Has never had prostate issues previously.  We asked him to come in to discuss this since he was concerned.    Review of Systems see above     Objective:   Physical Exam  Vital signs reviewed  He has boggy prostate both lobes consistent with prostatitis  Other labs reviewed with EMR entirely within normal limits with the exception of total cholesterol of 2 2 and an LDL cholesterol of 124 which will be discussed at time of physical exam.  Continues to have situational stress at work which was discussed at length.  15 minutes spent with patient.      Assessment & Plan:  Probable prostatitis  Plan: Doxycycline 100 mg twice daily for 10 days.  He will have repeat PSA and physical exam late November.

## 2018-12-15 ENCOUNTER — Other Ambulatory Visit: Payer: Self-pay

## 2018-12-15 ENCOUNTER — Other Ambulatory Visit: Payer: Self-pay | Admitting: Family Medicine

## 2018-12-15 ENCOUNTER — Ambulatory Visit: Payer: Self-pay

## 2018-12-15 DIAGNOSIS — M25572 Pain in left ankle and joints of left foot: Secondary | ICD-10-CM

## 2018-12-21 ENCOUNTER — Other Ambulatory Visit: Payer: Self-pay

## 2018-12-21 ENCOUNTER — Other Ambulatory Visit: Payer: No Typology Code available for payment source | Admitting: Internal Medicine

## 2018-12-21 DIAGNOSIS — E7800 Pure hypercholesterolemia, unspecified: Secondary | ICD-10-CM

## 2018-12-22 LAB — PSA: PSA: 1.3 ng/mL (ref <=4.0)

## 2018-12-23 ENCOUNTER — Encounter: Payer: Self-pay | Admitting: Internal Medicine

## 2018-12-23 ENCOUNTER — Other Ambulatory Visit: Payer: Self-pay

## 2018-12-23 ENCOUNTER — Ambulatory Visit (INDEPENDENT_AMBULATORY_CARE_PROVIDER_SITE_OTHER): Payer: No Typology Code available for payment source | Admitting: Internal Medicine

## 2018-12-23 VITALS — BP 130/80 | HR 88 | Temp 98.0°F | Ht 72.0 in | Wt 237.0 lb

## 2018-12-23 DIAGNOSIS — S91012D Laceration without foreign body, left ankle, subsequent encounter: Secondary | ICD-10-CM

## 2018-12-23 DIAGNOSIS — Z9889 Other specified postprocedural states: Secondary | ICD-10-CM | POA: Diagnosis not present

## 2018-12-23 DIAGNOSIS — S8011XA Contusion of right lower leg, initial encounter: Secondary | ICD-10-CM

## 2018-12-23 DIAGNOSIS — Z0000 Encounter for general adult medical examination without abnormal findings: Secondary | ICD-10-CM | POA: Diagnosis not present

## 2018-12-23 DIAGNOSIS — S9002XD Contusion of left ankle, subsequent encounter: Secondary | ICD-10-CM | POA: Diagnosis not present

## 2018-12-23 DIAGNOSIS — E7800 Pure hypercholesterolemia, unspecified: Secondary | ICD-10-CM | POA: Diagnosis not present

## 2018-12-23 NOTE — Progress Notes (Deleted)
   Subjective:    Patient ID: Jeffrey Burton, male    DOB: [birth year], 53 y.o.   MRN: [id number]  HPI 53 year old Male      History of lumbar laminectomy L4-L5 by Dr. Vertell Limber 2014  Review of Systems     Objective:   Physical Exam        Assessment & Plan:

## 2018-12-25 NOTE — Progress Notes (Signed)
   Subjective:    Patient ID: Jeffrey Burton, male    DOB: 10-01-1965, 53 y.o.   MRN: [id number]  HPI 53 year old Male in for health maintenance exam and evaluation of medical issues.  He had labs drawn in October in anticipation of his physical exam.  His PSA was elevated at 4.7 which was new for him.  Previously 1 year ago it had been 1.2.  He was asked to come in for prostate exam.  His prostate was found to be boggy and he was placed on doxycycline 100 mg twice a day for 10 days.  Repeat PSA done November 23 is normal at 1.40.  He has no symptoms at all.  He was walking from work back to his  car on the afternoon of November 17 which was parked near [employer] parking facility when he stepped on a grate which was not secured and he subsequently fell into a hole striking and injuring his left ankle.  He was taken to help an x-ray of the left ankle was negative for fracture.  He is still recovering at this point.      Review of Systems no back pain.  No significant joint pain.     Objective:   Physical Exam Vital signs reviewed  Patient has a contusion of his right inner thigh that is fairly large.  Has contusions of left ankle inner aspect . He has superficial abrasions  top of left ankle which are healing and showed no evidence of secondary bacterial infection.  Skin warm and dry.  Nodes none.  TMs are clear.  Neck is supple without JVD thyromegaly or carotid bruits.  Chest clear to auscultation.  Cardiac exam regular rate and rhythm normal S1 and S2 without murmurs or gallops.  Abdomen no hepatosplenomegaly masses or tenderness.  No lower extremity pitting edema.  Prostate is now normal and not boggy at all.  Symmetrical without nodules.  Neuro no focal deficits on brief neurological exam.  Thought, and affect are all within normal limits Total cholesterol is elevated to 2 was 215 in June 2019.  LDL cholesterol is 124 and previously was 128 in June 2019.  HDL was  68.  Triglycerides 81.  He is to work on diet and exercise and follow-up in 1 year.  He may want to consider low-dose Crestor therapy next year if cholesterol is not coming down.      Return in 1 year or as needed.  Recent bout of prostatitis-status post treatment nail resolved  with normal and lower PSA  Recent fall in parking lot due to unsecured grate with multiple contusions and superficial lacerations affecting right thigh and left ankle.  X-ray was negative at time of accident  Routine health maintenance-flu vaccine given through work  BMI 32.14-work on diet exercise  History of lumbar discectomy x2  Plan: Follow-up regarding accident in 4 weeks otherwise return in 1 year.  Watch diet and get more exercise.

## 2018-12-25 NOTE — Patient Instructions (Addendum)
It was a pleasure to see you today.  Watch diet and get more exercise.  Regarding recent accident, return for follow-up in 4 weeks otherwise 1 year follow-up.

## 2018-12-28 LAB — POCT URINALYSIS DIPSTICK
Appearance: NEGATIVE
Bilirubin, UA: NEGATIVE
Blood, UA: NEGATIVE
Glucose, UA: NEGATIVE
Ketones, UA: NEGATIVE
Leukocytes, UA: NEGATIVE
Nitrite, UA: NEGATIVE
Odor: NEGATIVE
Protein, UA: NEGATIVE
Spec Grav, UA: 1.015 (ref 1.010–1.025)
Urobilinogen, UA: 0.2 E.U./dL
pH, UA: 6.5 (ref 5.0–8.0)

## 2019-02-23 ENCOUNTER — Encounter: Payer: No Typology Code available for payment source | Admitting: Internal Medicine

## 2020-01-18 IMAGING — CR DG FOOT COMPLETE 3+V*R*
3 series · 3 of 3 positions shown · non-contrast
Comparison: None.

CLINICAL DATA: Continued acute RIGHT foot pain following injury 4
weeks ago. Initial encounter.

EXAM:
RIGHT FOOT COMPLETE - 3+ VIEW

[foot ap]
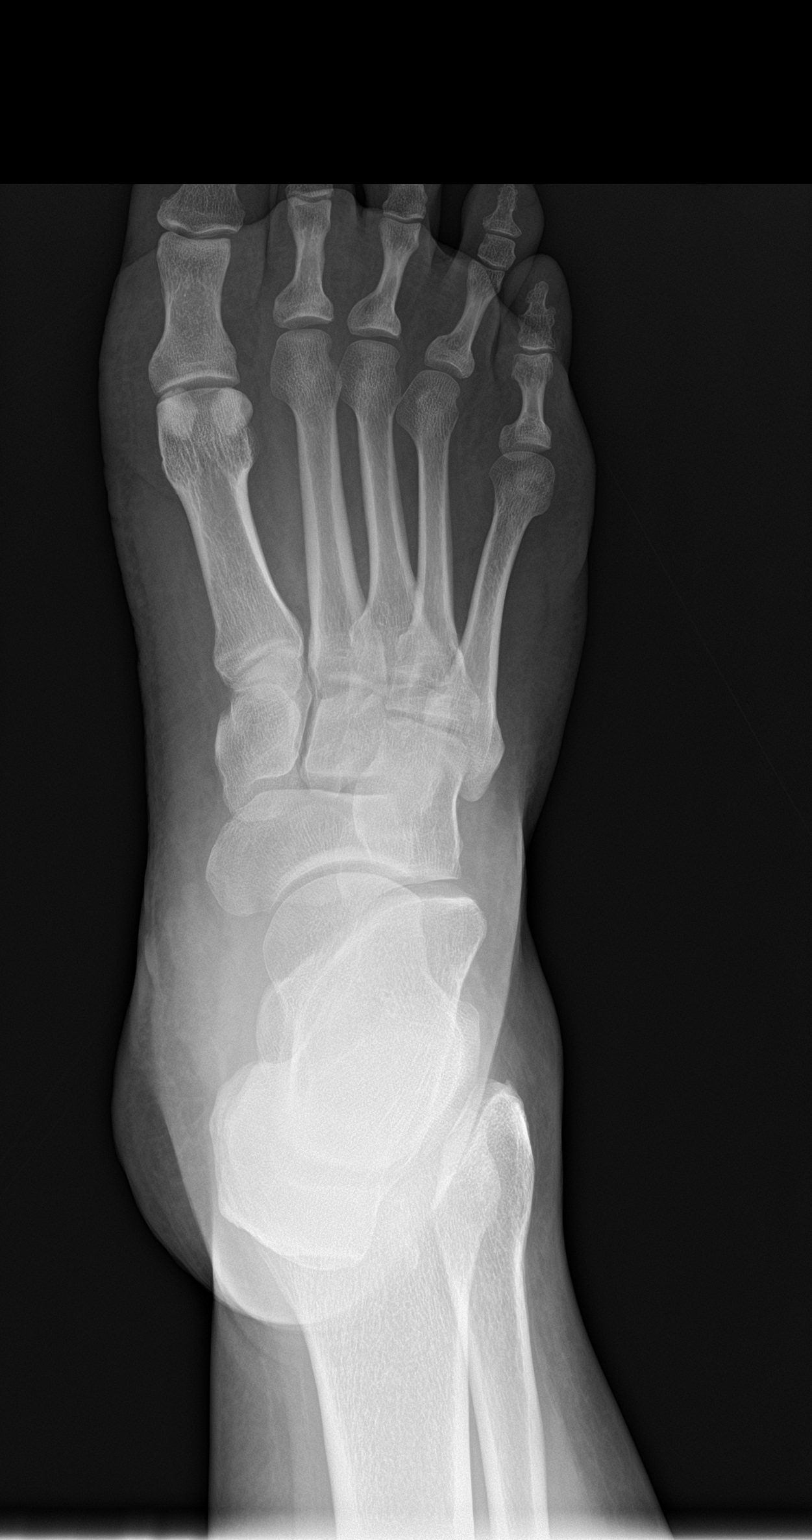

[foot obl]
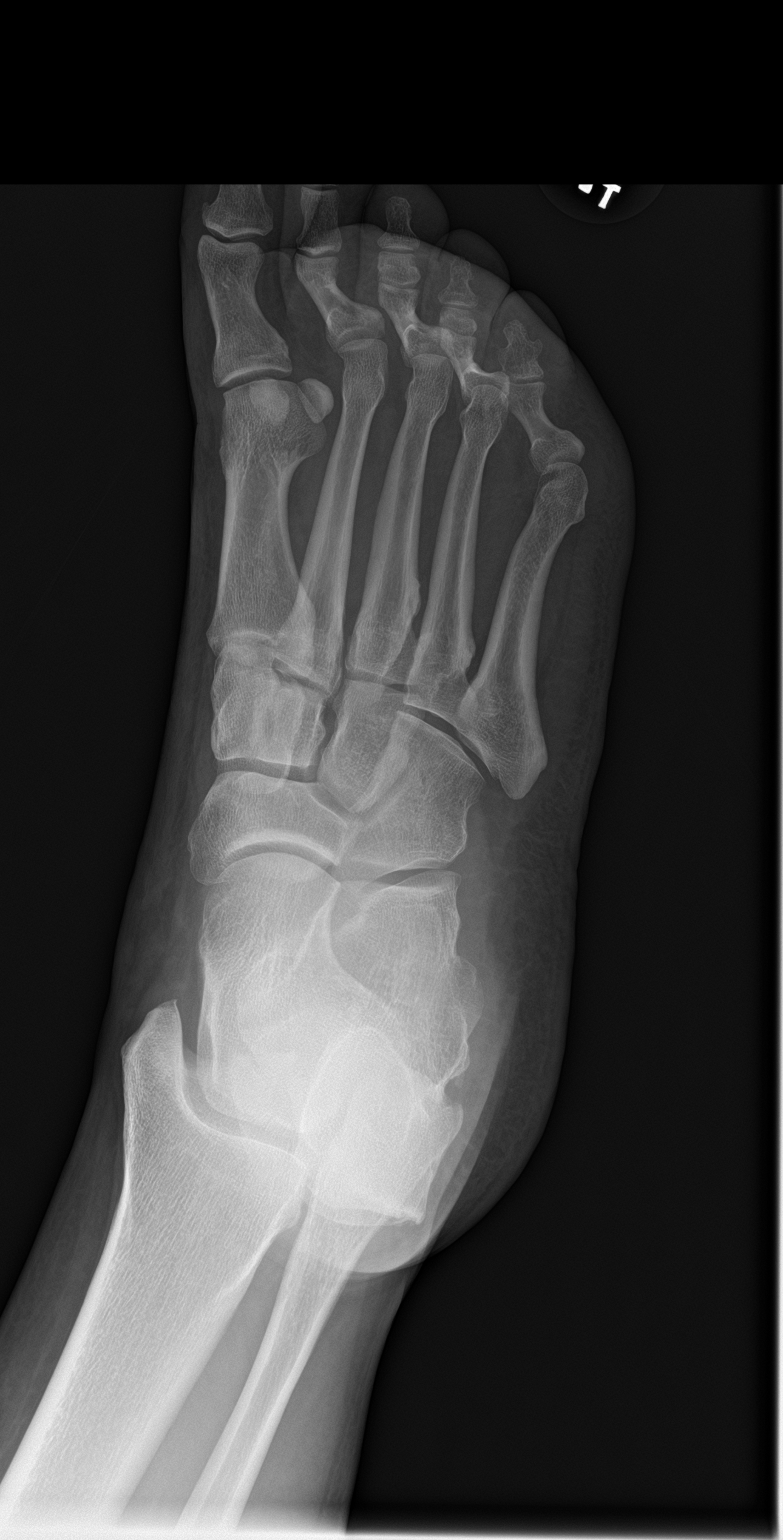

[foot lat]
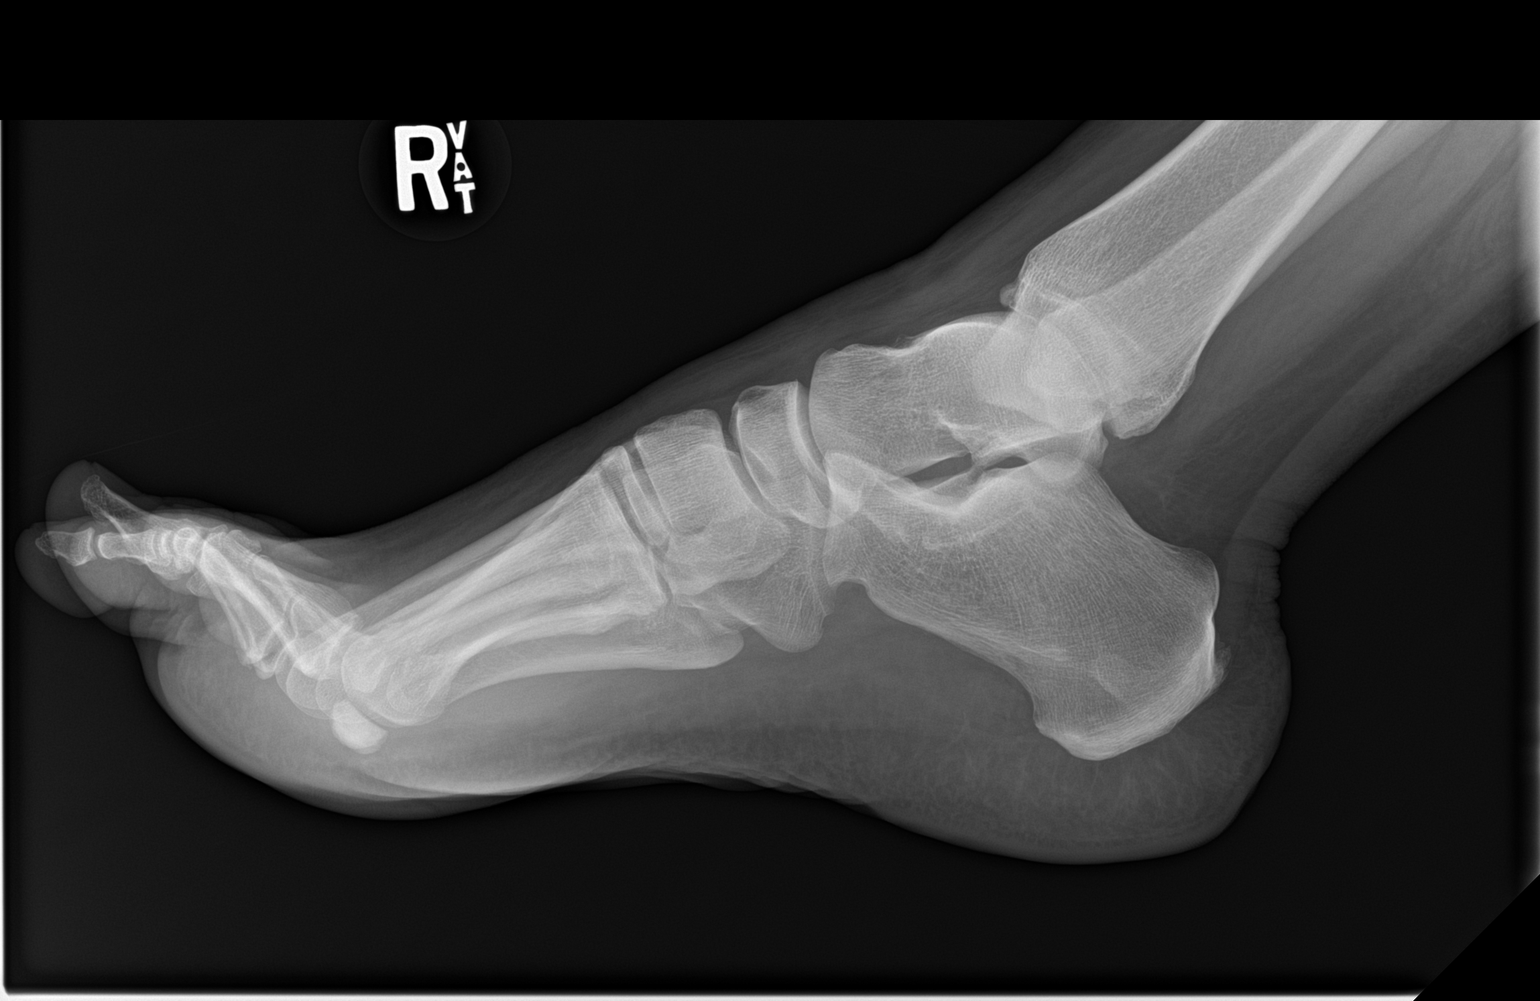

[3 of 3 positions shown; findings below may reference images not displayed]

FINDINGS: There is no evidence of fracture or dislocation. There is no
evidence of arthropathy or other focal bone abnormality. Soft
tissues are unremarkable.
IMPRESSION: Negative.

## 2020-06-21 ENCOUNTER — Telehealth: Payer: Self-pay | Admitting: Internal Medicine

## 2020-06-22 NOTE — Telephone Encounter (Signed)
scheduled

## 2020-06-22 NOTE — Telephone Encounter (Signed)
LVM to CB and schedule appointment 

## 2020-06-30 ENCOUNTER — Other Ambulatory Visit: Payer: No Typology Code available for payment source | Admitting: Internal Medicine

## 2020-07-03 ENCOUNTER — Encounter: Payer: No Typology Code available for payment source | Admitting: Internal Medicine

## 2020-08-22 ENCOUNTER — Other Ambulatory Visit (HOSPITAL_BASED_OUTPATIENT_CLINIC_OR_DEPARTMENT_OTHER): Payer: Self-pay

## 2020-08-22 ENCOUNTER — Encounter: Payer: Self-pay | Admitting: Internal Medicine

## 2020-08-22 ENCOUNTER — Telehealth: Payer: Self-pay | Admitting: Internal Medicine

## 2020-08-22 MED ORDER — DOXYCYCLINE HYCLATE 100 MG PO TABS: 100.0000 mg | ORAL_TABLET | Freq: Two times a day (BID) | ORAL | 0 refills | Status: DC

## 2020-08-22 MED ORDER — DOXYCYCLINE HYCLATE 100 MG PO TABS
100.0000 mg | ORAL_TABLET | Freq: Two times a day (BID) | ORAL | 0 refills | Status: DC
  Filled 2020-08-22: qty 20, 10d supply, fill #0

## 2020-08-22 NOTE — Telephone Encounter (Signed)
scheduled

## 2020-08-22 NOTE — Telephone Encounter (Signed)
Phone call to patient regarding symptoms. Has pain left epididymis.  Remote history of epididymitis several years ago.  This feels the same to him this time.  No fever or chills.  We are going to call in doxycycline 100 mg twice daily for 10 days to his pharmacy in [place] [facility] [place] [facility]  He will come in tomorrow for evaluation. MJB,MD

## 2020-08-22 NOTE — Telephone Encounter (Signed)
Jeffrey Burton [phone]  [name] called to say he is having swelling and pain in left testicle. Very uncomfortable. Wanted to call before it gets worse.

## 2020-08-23 ENCOUNTER — Encounter: Payer: Self-pay | Admitting: Internal Medicine

## 2020-08-23 ENCOUNTER — Other Ambulatory Visit: Payer: Self-pay

## 2020-08-23 ENCOUNTER — Ambulatory Visit (INDEPENDENT_AMBULATORY_CARE_PROVIDER_SITE_OTHER): Payer: No Typology Code available for payment source | Admitting: Internal Medicine

## 2020-08-23 VITALS — BP 130/80 | HR 52 | Temp 97.9°F | Ht 72.0 in | Wt 242.0 lb

## 2020-08-23 DIAGNOSIS — N451 Epididymitis: Secondary | ICD-10-CM | POA: Diagnosis not present

## 2020-08-23 DIAGNOSIS — N50812 Left testicular pain: Secondary | ICD-10-CM | POA: Diagnosis not present

## 2020-08-23 LAB — CBC WITH DIFFERENTIAL/PLATELET
Absolute Monocytes: 553 cells/uL (ref 200–950)
Basophils Absolute: 79 cells/uL (ref 0–200)
Basophils Relative: 1 %
Eosinophils Absolute: 71 cells/uL (ref 15–500)
Eosinophils Relative: 0.9 %
HCT: 48.5 % (ref 38.5–50.0)
Hemoglobin: 16.2 g/dL (ref 13.2–17.1)
Lymphs Abs: 2599 cells/uL (ref 850–3900)
MCH: 29.9 pg (ref 27.0–33.0)
MCHC: 33.4 g/dL (ref 32.0–36.0)
MCV: 89.6 fL (ref 80.0–100.0)
MPV: 9.6 fL (ref 7.5–12.5)
Monocytes Relative: 7 %
Neutro Abs: 4598 cells/uL (ref 1500–7800)
Neutrophils Relative %: 58.2 %
Platelets: 282 10*3/uL (ref 140–400)
RBC: 5.41 10*6/uL (ref 4.20–5.80)
RDW: 12.8 % (ref 11.0–15.0)
Total Lymphocyte: 32.9 %
WBC: 7.9 10*3/uL (ref 3.8–10.8)

## 2020-08-23 MED ORDER — HYDROCODONE-ACETAMINOPHEN 5-325 MG PO TABS: 1.0000 | ORAL_TABLET | Freq: Four times a day (QID) | ORAL | 0 refills | Status: DC | PRN

## 2020-08-23 MED ORDER — CEFTRIAXONE SODIUM 1 G IJ SOLR
1.0000 g | Freq: Once | INTRAMUSCULAR | Status: AC
  Administered 2020-08-23: 1 g via INTRAMUSCULAR

## 2020-08-23 NOTE — Progress Notes (Signed)
   Subjective:    Patient ID: Jeffrey Burton, male    DOB: 11-18-1965, 55 y.o.   MRN: [id number]  HPI 55 year old Male called yesterday saying he was having swelling and pain in the left testicle.  He has been doing  some indoor renovations about his home.  May have done some heavy lifting.  I spoke with him yesterday by phone.  Says he had tenderness top of left testicle.  Michela Pitcher it reminded him of prior episode of epididymitis some years ago.  Reported no fever or chills.  No dysuria.  Appointment was given for today.  His general health is good.  He works as a Public relations account executive at [employer].  He takes no chronic medications.  Past medical history includes history of fractured clavicle in the remote past.  No chronic medications.  No known drug allergies.  At L4-L5 discectomy by Dr. Vertell Limber in 2013.  Had similar procedure in [facility] at [facility].  These records are not on Care Everywhere however.  Social history: He is married.  Completed 2 years of college.  He is a native of [place] and formerly served in the [employer] in [place].  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    Review of Systems no nausea or vomiting.  No dysuria.  No hematuria.     Objective:   Physical Exam Temperature 97.9 degrees blood pressure 130/80 pulse oximetry 98% weight 242 pounds BMI 32.82  Genitalia: Normal male external genitalia.  He is exquisitely tender top of the left testicle.       Assessment & Plan:  Probable left epididymitis  Plan: CBC with differential drawn and results pending.  Doxycycline 100 mg twice daily for 10 days.  Note to be out of work today and possibly tomorrow if not feeling well.  Hydrocodone APAP 5/325: One tablet every 6-8 hours as needed for pain.  1 g IM Rocephin given in office today.

## 2020-08-23 NOTE — Patient Instructions (Signed)
You received 1 g IM Rocephin in the office today for probable epididymitis.  Start taking doxycycline 100 mg twice daily for 10 days.  CBC with differential drawn and pending.  May take hydrocodone APAP 5/325 every 6-8 hours if needed for pain.  Note to be out of work today and possibly tomorrow.  Rest at home and stay well-hydrated.

## 2020-08-24 ENCOUNTER — Telehealth: Payer: Self-pay | Admitting: Internal Medicine

## 2020-08-24 ENCOUNTER — Encounter: Payer: Self-pay | Admitting: Internal Medicine

## 2020-08-24 NOTE — Telephone Encounter (Addendum)
Jeffrey Burton [phone]  Rich called to say he is feeling much better, however he went out to the garden and when he cam back in he was broke out in something like hives all over and itching. Arms, back , legs. Could be something he go into in garden or allergic reaction to medication.   Have called patient back.He is not having respiratory distress but hive like itchy lesions mainly arms and upper legs and perineal area. Was only in the sun about 10 minutes. Prior to this, he was feeling better with regard to epididymitis on Doxycycline.He is to stop Doxycycline. Take Benadryl 50 mg eevry 6- 8 hours as needed. May apply Calamine lotion to rash. See tomorrow.

## 2020-08-25 NOTE — Telephone Encounter (Signed)
Called patient to check to see how he was doing, Spoke with Levada Dy his wife. Patient was downstairs painting. She stated he was doing good the rash was gone, he has sleep good, got up about midnight took another Benadryl, then he sleep till about 8:30. She went down and ask him, he does not need anything called in will call on Monday if he thinks he needs another antibiotic.

## 2020-09-01 ENCOUNTER — Other Ambulatory Visit: Payer: Self-pay

## 2020-09-01 ENCOUNTER — Other Ambulatory Visit: Payer: No Typology Code available for payment source | Admitting: Internal Medicine

## 2020-09-01 DIAGNOSIS — Z0000 Encounter for general adult medical examination without abnormal findings: Secondary | ICD-10-CM

## 2020-09-01 DIAGNOSIS — R972 Elevated prostate specific antigen [PSA]: Secondary | ICD-10-CM

## 2020-09-01 DIAGNOSIS — E7800 Pure hypercholesterolemia, unspecified: Secondary | ICD-10-CM

## 2020-09-01 DIAGNOSIS — N410 Acute prostatitis: Secondary | ICD-10-CM

## 2020-09-02 LAB — CBC WITH DIFFERENTIAL/PLATELET
Absolute Monocytes: 566 cells/uL (ref 200–950)
Basophils Absolute: 50 cells/uL (ref 0–200)
Basophils Relative: 0.9 %
Eosinophils Absolute: 101 cells/uL (ref 15–500)
Eosinophils Relative: 1.8 %
HCT: 47.6 % (ref 38.5–50.0)
Hemoglobin: 16 g/dL (ref 13.2–17.1)
Lymphs Abs: 2330 cells/uL (ref 850–3900)
MCH: 30 pg (ref 27.0–33.0)
MCHC: 33.6 g/dL (ref 32.0–36.0)
MCV: 89.1 fL (ref 80.0–100.0)
MPV: 9.7 fL (ref 7.5–12.5)
Monocytes Relative: 10.1 %
Neutro Abs: 2554 cells/uL (ref 1500–7800)
Neutrophils Relative %: 45.6 %
Platelets: 274 10*3/uL (ref 140–400)
RBC: 5.34 10*6/uL (ref 4.20–5.80)
RDW: 12.9 % (ref 11.0–15.0)
Total Lymphocyte: 41.6 %
WBC: 5.6 10*3/uL (ref 3.8–10.8)

## 2020-09-02 LAB — LIPID PANEL
Cholesterol: 245 mg/dL — ABNORMAL HIGH (ref <200)
HDL: 80 mg/dL (ref >=40)
LDL Cholesterol (Calc): 147 mg/dL (calc) — ABNORMAL HIGH
Non-HDL Cholesterol (Calc): 165 mg/dL (calc) — ABNORMAL HIGH (ref <130)
Total CHOL/HDL Ratio: 3.1 (calc) (ref <5.0)
Triglycerides: 77 mg/dL (ref <150)

## 2020-09-02 LAB — COMPLETE METABOLIC PANEL WITH GFR
AG Ratio: 1.9 (calc) (ref 1.0–2.5)
ALT: 37 U/L (ref 9–46)
AST: 32 U/L (ref 10–35)
Albumin: 4.6 g/dL (ref 3.6–5.1)
Alkaline phosphatase (APISO): 53 U/L (ref 35–144)
BUN: 18 mg/dL (ref 7–25)
CO2: 26 mmol/L (ref 20–32)
Calcium: 9.3 mg/dL (ref 8.6–10.3)
Chloride: 102 mmol/L (ref 98–110)
Creat: 0.99 mg/dL (ref 0.70–1.30)
Globulin: 2.4 g/dL (calc) (ref 1.9–3.7)
Glucose, Bld: 90 mg/dL (ref 65–99)
Potassium: 4.8 mmol/L (ref 3.5–5.3)
Sodium: 138 mmol/L (ref 135–146)
Total Bilirubin: 0.9 mg/dL (ref 0.2–1.2)
Total Protein: 7 g/dL (ref 6.1–8.1)
eGFR: 89 mL/min/{1.73_m2} (ref >=60)

## 2020-09-02 LAB — PSA: PSA: 1.81 ng/mL (ref <=4.00)

## 2020-09-08 ENCOUNTER — Ambulatory Visit (INDEPENDENT_AMBULATORY_CARE_PROVIDER_SITE_OTHER): Payer: No Typology Code available for payment source | Admitting: Internal Medicine

## 2020-09-08 ENCOUNTER — Other Ambulatory Visit: Payer: Self-pay

## 2020-09-08 ENCOUNTER — Encounter: Payer: Self-pay | Admitting: Internal Medicine

## 2020-09-08 VITALS — BP 130/80 | Ht 72.0 in | Wt 244.0 lb

## 2020-09-08 DIAGNOSIS — Z9889 Other specified postprocedural states: Secondary | ICD-10-CM | POA: Diagnosis not present

## 2020-09-08 DIAGNOSIS — E7800 Pure hypercholesterolemia, unspecified: Secondary | ICD-10-CM | POA: Diagnosis not present

## 2020-09-08 DIAGNOSIS — Z6833 Body mass index (BMI) 33.0-33.9, adult: Secondary | ICD-10-CM | POA: Diagnosis not present

## 2020-09-08 DIAGNOSIS — Z0000 Encounter for general adult medical examination without abnormal findings: Secondary | ICD-10-CM | POA: Diagnosis not present

## 2020-09-08 LAB — POCT URINALYSIS DIPSTICK
Appearance: NEGATIVE
Bilirubin, UA: NEGATIVE
Blood, UA: NEGATIVE
Glucose, UA: NEGATIVE
Ketones, UA: NEGATIVE
Leukocytes, UA: NEGATIVE
Nitrite, UA: NEGATIVE
Odor: NEGATIVE
Protein, UA: NEGATIVE
Spec Grav, UA: 1.01 (ref 1.010–1.025)
Urobilinogen, UA: 0.2 E.U./dL
pH, UA: 6 (ref 5.0–8.0)

## 2020-09-08 MED ORDER — ROSUVASTATIN CALCIUM 20 MG PO TABS: 20.0000 mg | ORAL_TABLET | Freq: Every day | ORAL | 3 refills | Status: DC

## 2020-09-08 NOTE — Progress Notes (Signed)
tor  Subjective:    Patient ID: Jeffrey Burton, male    DOB: Nov 04, 1965, 55 y.o.   MRN: [id number]  HPI 55 year old Male for health maintenance exam and evaluation of medical issues. No more issues with left epididymis.  In July was treated with doxycycline for left epididymitis and developed hives.  He treated hives with Benadryl and they resolved.  He also stopped doxycycline.    In 2020 he was walking from work back to his car on the afternoon of November 17 which was part at [facility] parking facility when he stepped on a great which was not secured and fell into a hole striking and injuring his left ankle.  He was taken to health at work where an x-ray of the left ankle was negative for fracture.  This subsequently improved.  He has elevated total cholesterol of 245 and an LDL cholesterol of 147.  HDL is excellent at 80 and triglycerides normal at 77.  He really should consider statin therapy.  PSA is normal CBC and c-Met are normal.  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  He had lumbar disc surgery at [facility] in [place] but cannot really remember the exact level.  These records are not available in care everywhere.  He subsequently had left lower extremity radiculopathy in 2013 evaluated by MRI and was found to have a left 4 disc extrusion L4-L5 and had surgery by Dr. Vertell Limber and has done well.  History of fractured clavicle in the remote past.  No chronic medications.  No known drug allergies.  Social history: He is married.  Completed 2 years of college.  He is a native of [place] and formerly served in the [employer] in [place].  He worked in [employer] in [place] and then moved to [place].  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  Family  history: Father with history of hyperlipidemia and hypertension still living in his 47s.  Mother died of metastatic breast cancer.  1 brother in his 74s with history of hypertension and hyperlipidemia.  Son who was overweight has had issues with elevated blood pressure and hyperlipidemia but has lost weight over the past couple of years.     Review of Systems no new complaints.  Epididymis feels okay     Objective:   Physical Exam BMI 33.09 weight 244 pounds height 6 feet 0 inches blood pressure 130/80  Skin is warm and dry.  No cervical adenopathy or thyromegaly.  No bruits.  Chest is clear to auscultation.  Cardiac exam: Regular rate and rhythm without ectopy or murmurs.  Abdomen soft nondistended without hepatosplenomegaly masses or tenderness.  Prostate is normal.  No lower extremity pitting edema.  Neuro is intact without focal deficits.  Affect thought and judgment are normal.  No hernias to direct palpation.       Assessment & Plan:  Recent episode of epididymitis versus musculoskeletal pain-resolved.  Unfortunately he developed hives after taking doxycycline and suspect he is allergic to doxycycline.  Elevated BMI 33.09 -needs to lose weight  Elevated lipids-total cholesterol 245 and LDL cholesterol 147.  Needs to consider statin medication.  He has a high HDL of 80 and triglycerides are normal.  History of lumbar discectomy  x2  Plan: Return in 1 year or as needed but encourage statin therapy in the meantime if he changes his mind.  He is due for colonoscopy.  He will get flu vaccine through employment.  Records indicate he has had 3 COVID vaccines.  If he injures himself he will need updated tetanus immunization as we do not have that on file.  Consider Shingrix vaccine and pneumococcal 20.  Referral for colonoscopy.

## 2020-09-08 NOTE — Patient Instructions (Addendum)
Referral for colonoscopy.  Please try to lose some weight.  We are recommending statin therapy.  Consider COVID booster in the fall along with your flu vaccine.  Consider Shingrix vaccine and pneumococcal 20.  You will need tetanus immunization if you injure yourself as we do not have an updated tetanus on file.  It was a pleasure to see you today.    Prescription sent for Crestor and follow up in 3 months. Consider Cardiac CT scoring.

## 2020-09-19 ENCOUNTER — Encounter: Payer: Self-pay | Admitting: Internal Medicine

## 2020-09-27 ENCOUNTER — Other Ambulatory Visit (HOSPITAL_COMMUNITY): Payer: Self-pay

## 2020-09-27 MED ORDER — QUICKVUE AT-HOME COVID-19 TEST VI KIT
PACK | 0 refills | Status: DC
  Filled 2020-09-27: qty 2, 2d supply, fill #0

## 2020-10-14 ENCOUNTER — Encounter: Payer: Self-pay | Admitting: Internal Medicine

## 2020-12-01 ENCOUNTER — Ambulatory Visit (AMBULATORY_SURGERY_CENTER): Payer: No Typology Code available for payment source | Admitting: *Deleted

## 2020-12-01 ENCOUNTER — Other Ambulatory Visit (HOSPITAL_BASED_OUTPATIENT_CLINIC_OR_DEPARTMENT_OTHER): Payer: Self-pay

## 2020-12-01 ENCOUNTER — Other Ambulatory Visit: Payer: Self-pay

## 2020-12-01 ENCOUNTER — Other Ambulatory Visit (HOSPITAL_COMMUNITY): Payer: Self-pay

## 2020-12-01 VITALS — Ht 72.0 in | Wt 244.0 lb

## 2020-12-01 DIAGNOSIS — Z800 Family history of malignant neoplasm of digestive organs: Secondary | ICD-10-CM

## 2020-12-01 DIAGNOSIS — Z1211 Encounter for screening for malignant neoplasm of colon: Secondary | ICD-10-CM

## 2020-12-01 MED ORDER — NA SULFATE-K SULFATE-MG SULF 17.5-3.13-1.6 GM/177ML PO SOLN
1.0000 | ORAL | 0 refills | Status: DC
  Filled 2020-12-01 (×2): qty 354, 1d supply, fill #0

## 2020-12-01 NOTE — Progress Notes (Signed)

## 2020-12-05 ENCOUNTER — Other Ambulatory Visit: Payer: No Typology Code available for payment source | Admitting: Internal Medicine

## 2020-12-05 ENCOUNTER — Other Ambulatory Visit: Payer: Self-pay

## 2020-12-05 DIAGNOSIS — E7800 Pure hypercholesterolemia, unspecified: Secondary | ICD-10-CM

## 2020-12-06 LAB — LIPID PANEL
Cholesterol: 250 mg/dL — ABNORMAL HIGH (ref <200)
HDL: 88 mg/dL (ref >=40)
LDL Cholesterol (Calc): 141 mg/dL (calc) — ABNORMAL HIGH
Non-HDL Cholesterol (Calc): 162 mg/dL (calc) — ABNORMAL HIGH (ref <130)
Total CHOL/HDL Ratio: 2.8 (calc) (ref <5.0)
Triglycerides: 101 mg/dL (ref <150)

## 2020-12-06 LAB — COMPLETE METABOLIC PANEL WITH GFR
AG Ratio: 1.7 (calc) (ref 1.0–2.5)
ALT: 30 U/L (ref 9–46)
AST: 23 U/L (ref 10–35)
Albumin: 4.6 g/dL (ref 3.6–5.1)
Alkaline phosphatase (APISO): 53 U/L (ref 35–144)
BUN: 9 mg/dL (ref 7–25)
CO2: 22 mmol/L (ref 20–32)
Calcium: 9.8 mg/dL (ref 8.6–10.3)
Chloride: 103 mmol/L (ref 98–110)
Creat: 0.86 mg/dL (ref 0.70–1.30)
Globulin: 2.7 g/dL (calc) (ref 1.9–3.7)
Glucose, Bld: 102 mg/dL — ABNORMAL HIGH (ref 65–99)
Potassium: 4.7 mmol/L (ref 3.5–5.3)
Sodium: 138 mmol/L (ref 135–146)
Total Bilirubin: 1 mg/dL (ref 0.2–1.2)
Total Protein: 7.3 g/dL (ref 6.1–8.1)
eGFR: 102 mL/min/{1.73_m2} (ref >=60)

## 2020-12-15 ENCOUNTER — Ambulatory Visit (AMBULATORY_SURGERY_CENTER): Payer: No Typology Code available for payment source | Admitting: Internal Medicine

## 2020-12-15 ENCOUNTER — Other Ambulatory Visit: Payer: Self-pay

## 2020-12-15 ENCOUNTER — Encounter: Payer: Self-pay | Admitting: Internal Medicine

## 2020-12-15 VITALS — BP 124/84 | HR 50 | Temp 96.9°F | Resp 15 | Ht 72.0 in | Wt 244.0 lb

## 2020-12-15 DIAGNOSIS — D128 Benign neoplasm of rectum: Secondary | ICD-10-CM

## 2020-12-15 DIAGNOSIS — D123 Benign neoplasm of transverse colon: Secondary | ICD-10-CM

## 2020-12-15 DIAGNOSIS — D125 Benign neoplasm of sigmoid colon: Secondary | ICD-10-CM

## 2020-12-15 DIAGNOSIS — Z1211 Encounter for screening for malignant neoplasm of colon: Secondary | ICD-10-CM

## 2020-12-15 MED ORDER — SODIUM CHLORIDE 0.9 % IV SOLN: 500.0000 mL | INTRAVENOUS | Status: DC

## 2020-12-15 NOTE — Op Note (Signed)
[name] Patient Name: Jeffrey Burton Procedure Date: 12/15/2020 8:59 AM MRN: [id number] Endoscopist: Gatha Mayer , MD Age: 55 Referring MD:  Date of Birth: [birth year] Gender: Male Account #: [id number] Procedure:                Colonoscopy Indications:              Screening in patient at increased risk: Colorectal                            cancer in father 66 or older, Colon cancer                            screening in patient at increased risk: Family                            history of colorectal cancer in multiple 2nd degree                            relatives Medicines:                Propofol per Anesthesia, Monitored Anesthesia Care Procedure:                Pre-Anesthesia Assessment:                           - Prior to the procedure, a History and Physical                            was performed, and patient medications and                            allergies were reviewed. The patient's tolerance of                            previous anesthesia was also reviewed. The risks                            and benefits of the procedure and the sedation                            options and risks were discussed with the patient.                            All questions were answered, and informed consent                            was obtained. Prior Anticoagulants: The patient has                            taken no previous anticoagulant or antiplatelet                            agents. ASA Grade Assessment: II - A patient with  mild systemic disease. After reviewing the risks                            and benefits, the patient was deemed in                            satisfactory condition to undergo the procedure.                           After obtaining informed consent, the colonoscope                            was passed under direct vision. Throughout the                            procedure, the patient's blood  pressure, pulse, and                            oxygen saturations were monitored continuously. The                            CF [id number] #[id number] was introduced through the anus                            and advanced to the the cecum, identified by                            appendiceal orifice and ileocecal valve. The                            colonoscopy was performed without difficulty. The                            patient tolerated the procedure well. The quality                            of the bowel preparation was good. The bowel                            preparation used was SUPREP via split dose                            instruction. The ileocecal valve, appendiceal                            orifice, and rectum were photographed. Scope In: 9:14:12 AM Scope Out: 9:31:24 AM Scope Withdrawal Time: 0 hours 15 minutes 35 seconds  Total Procedure Duration: 0 hours 17 minutes 12 seconds  Findings:                 The perianal and digital rectal examinations were                            normal. Pertinent negatives include normal prostate                            (  size, shape, and consistency).                           Three sessile polyps were found in the rectum,                            sigmoid colon and transverse colon. The polyps were                            diminutive in size. These polyps were removed with                            a cold snare. Resection and retrieval were                            complete. Verification of patient identification                            for the specimen was done. Estimated blood loss was                            minimal.                           The exam was otherwise without abnormality on                            direct and retroflexion views. Complications:            No immediate complications. Estimated Blood Loss:     Estimated blood loss was minimal. Impression:               - Three diminutive polyps in the  rectum, in the                            sigmoid colon and in the transverse colon, removed                            with a cold snare. Resected and retrieved.                           - The examination was otherwise normal on direct                            and retroflexion views. Recommendation:           - Patient has a contact number available for                            emergencies. The signs and symptoms of potential                            delayed complications were discussed with the  patient. Return to normal activities tomorrow.                            Written discharge instructions were provided to the                            patient.                           - Resume previous diet.                           - Continue present medications.                           - Repeat colonoscopy is recommended. The                            colonoscopy date will be determined after pathology                            results from today's exam become available for                            review. Gatha Mayer, MD 12/15/2020 9:37:37 AM This report has been signed electronically.

## 2020-12-15 NOTE — Progress Notes (Signed)
[facility] History and Physical   Primary Care Physician:  Elby Showers, MD   Reason for Procedure:   Colon cancer screening  Plan:    colonoscopy     HPI: Jeffrey Burton is a 55 y.o. male here for a screening colonoscopy Father had CRCA at age 33 + 2 cousins  Past Medical History:  Diagnosis Date   No pertinent past medical history     Past Surgical History:  Procedure Laterality Date   BACK SURGERY     KNEE SURGERY     left knee   LUMBAR LAMINECTOMY/DECOMPRESSION MICRODISCECTOMY  02/21/2012   Procedure: LUMBAR LAMINECTOMY/DECOMPRESSION MICRODISCECTOMY 1 LEVEL;  Surgeon: Erline Levine, MD;  Location: [facility];  Service: Neurosurgery;  Laterality: Left;  Redo Left Lumbar four-five Microdiskectomy    Prior to Admission medications   Not on File    No current outpatient medications on file.   Current Facility-Administered Medications  Medication Dose Route Frequency Provider Last Rate Last Admin   0.9 %  sodium chloride infusion  500 mL Intravenous Continuous Gatha Mayer, MD        Allergies as of 12/15/2020   (No Known Allergies)    Family History  Problem Relation Age of Onset   Breast cancer Mother    Colon cancer Father 38   Hypertension Father    Colon cancer Cousin    Colon cancer Cousin    Esophageal cancer Neg Hx    Stomach cancer Neg Hx     Social History   Socioeconomic History   Marital status: Married    Spouse name: Not on file   Number of children: Not on file   Years of education: Not on file   Highest education level: Not on file  Occupational History   Not on file  Tobacco Use   Smoking status: Never   Smokeless tobacco: Never  Vaping Use   Vaping Use: Never used  Substance and Sexual Activity   Alcohol use: Yes    Alcohol/week: 2.0 standard drinks    Types: 2 Glasses of wine per week    Comment: 2 glasses Saturday night occ   Drug use: No     Review of Systems:  other review of systems negative  except as mentioned in the HPI.  Physical Exam: Vital signs BP (!) 142/75   Pulse 61   Temp (!) 96.9 F (36.1 C)   Ht 6' (1.829 m)   Wt 244 lb (110.7 kg)   SpO2 100%   BMI 33.09 kg/m   General:   Alert,  Well-developed, well-nourished, pleasant and cooperative in NAD Lungs:  Clear throughout to auscultation.   Heart:  Regular rate and rhythm; no murmurs, clicks, rubs,  or gallops. Abdomen:  Soft, nontender and nondistended. Normal bowel sounds.   Neuro/Psych:  Alert and cooperative. Normal mood and affect. A and O x 3   @Permelia Bamba  Simonne Maffucci, MD, [facility] [phone] (pager) 12/15/2020 9:05 AM@

## 2020-12-15 NOTE — Patient Instructions (Addendum)
I found and removed 3 tiny polyps.  I will let you know pathology results and when to have another routine colonoscopy by mail and/or My Chart. Anticipate 5 years.  I appreciate the opportunity to care for you. Gatha Mayer, MD, FACG  YOU HAD AN ENDOSCOPIC PROCEDURE TODAY AT [facility]:   Refer to the procedure report that was given to you for any specific questions about what was found during the examination.  If the procedure report does not answer your questions, please call your gastroenterologist to clarify.  If you requested that your care partner not be given the details of your procedure findings, then the procedure report has been included in a sealed envelope for you to review at your convenience later.  YOU SHOULD EXPECT: Some feelings of bloating in the abdomen. Passage of more gas than usual.  Walking can help get rid of the air that was put into your GI tract during the procedure and reduce the bloating. If you had a lower endoscopy (such as a colonoscopy or flexible sigmoidoscopy) you may notice spotting of blood in your stool or on the toilet paper. If you underwent a bowel prep for your procedure, you may not have a normal bowel movement for a few days.  Please Note:  You might notice some irritation and congestion in your nose or some drainage.  This is from the oxygen used during your procedure.  There is no need for concern and it should clear up in a day or so.  SYMPTOMS TO REPORT IMMEDIATELY:  Following lower endoscopy (colonoscopy or flexible sigmoidoscopy):  Excessive amounts of blood in the stool  Significant tenderness or worsening of abdominal pains  Swelling of the abdomen that is new, acute  Fever of 100F or higher   For urgent or emergent issues, a gastroenterologist can be reached at any hour by calling [phone]. Do not use MyChart messaging for urgent concerns.    DIET:  We do recommend a small meal at first, but then you may  proceed to your regular diet.  Drink plenty of fluids but you should avoid alcoholic beverages for 24 hours.  ACTIVITY:  You should plan to take it easy for the rest of today and you should NOT DRIVE or use heavy machinery until tomorrow (because of the sedation medicines used during the test).    FOLLOW UP: Our staff will call the number listed on your records 48-72 hours following your procedure to check on you and address any questions or concerns that you may have regarding the information given to you following your procedure. If we do not reach you, we will leave a message.  We will attempt to reach you two times.  During this call, we will ask if you have developed any symptoms of COVID 19. If you develop any symptoms (ie: fever, flu-like symptoms, shortness of breath, cough etc.) before then, please call [phone].  If you test positive for Covid 19 in the 2 weeks post procedure, please call and report this information to [place].    If any biopsies were taken you will be contacted by phone or by letter within the next 1-3 weeks.  Please call us at [phone] if you have not heard about the biopsies in 3 weeks.    SIGNATURES/CONFIDENTIALITY: You and/or your care partner have signed paperwork which will be entered into your electronic medical record.  These signatures attest to the fact that that the information above on  your After Visit Summary has been reviewed and is understood.  Full responsibility of the confidentiality of this discharge information lies with you and/or your care-partner.

## 2020-12-15 NOTE — Progress Notes (Signed)
PT taken to PACU. Monitors in place. VSS. Report given to RN. 

## 2020-12-15 NOTE — Progress Notes (Signed)
Pt's states no medical or surgical changes since previsit or office visit.  VS taken by [place]

## 2020-12-15 NOTE — Progress Notes (Signed)
Called to room to assist during endoscopic procedure.  Patient ID and intended procedure confirmed with present staff. Received instructions for my participation in the procedure from the performing physician.  

## 2020-12-19 ENCOUNTER — Telehealth: Payer: Self-pay

## 2020-12-19 ENCOUNTER — Telehealth: Payer: Self-pay | Admitting: *Deleted

## 2020-12-19 NOTE — Telephone Encounter (Signed)
  Follow up Call-  Call back number 12/15/2020  Post procedure Call Back phone  # [phone]  Permission to leave phone message Yes  Some recent data might be hidden     Patient questions:  Do you have a fever, pain , or abdominal swelling? No. Pain Score  0 *  Have you tolerated food without any problems? Yes.    Have you been able to return to your normal activities? Yes.    Do you have any questions about your discharge instructions: Diet   No. Medications  No. Follow up visit  No.  Do you have questions or concerns about your Care? No.  Actions: * If pain score is 4 or above: No action needed, pain <4.  Have you developed a fever since your procedure? no  2.   Have you had an respiratory symptoms (SOB or cough) since your procedure? no  3.   Have you tested positive for COVID 19 since your procedure no  4.   Have you had any family members/close contacts diagnosed with the COVID 19 since your procedure?  no   If yes to any of these questions please route to Joylene John, RN and Joella Prince, RN

## 2020-12-19 NOTE — Telephone Encounter (Signed)
  Follow up Call-  Call back number 12/15/2020  Post procedure Call Back phone  # [phone]  Permission to leave phone message Yes  Some recent data might be hidden     Left message

## 2020-12-24 ENCOUNTER — Encounter: Payer: Self-pay | Admitting: Internal Medicine

## 2020-12-24 DIAGNOSIS — Z86010 Personal history of colonic polyps: Secondary | ICD-10-CM

## 2020-12-24 DIAGNOSIS — Z860101 Personal history of adenomatous and serrated colon polyps: Secondary | ICD-10-CM

## 2020-12-24 DIAGNOSIS — Z800 Family history of malignant neoplasm of digestive organs: Secondary | ICD-10-CM | POA: Insufficient documentation

## 2020-12-24 HISTORY — DX: Personal history of colonic polyps: Z86.010

## 2020-12-24 HISTORY — DX: Personal history of adenomatous and serrated colon polyps: Z86.0101

## 2021-01-13 ENCOUNTER — Telehealth: Payer: No Typology Code available for payment source | Admitting: Nurse Practitioner

## 2021-01-13 DIAGNOSIS — U071 COVID-19: Secondary | ICD-10-CM

## 2021-01-13 MED ORDER — BENZONATATE 100 MG PO CAPS: 100.0000 mg | ORAL_CAPSULE | Freq: Three times a day (TID) | ORAL | 0 refills | Status: DC | PRN

## 2021-01-13 MED ORDER — FLUTICASONE PROPIONATE 50 MCG/ACT NA SUSP: 2.0000 | Freq: Every day | NASAL | 6 refills | Status: DC

## 2021-01-13 NOTE — Progress Notes (Signed)
E-Visit  for Positive Covid Test Result  We are sorry you are not feeling well. We are here to help!  You have tested positive for COVID-19, meaning that you were infected with the novel coronavirus and could give the virus to others.  It is vitally important that you stay home so you do not spread it to others.      Please continue isolation at home, for at least 10 days since the start of your symptoms and until you have had 24 hours with no fever (without taking a fever reducer) and with improving of symptoms.  If you have no symptoms but tested positive (or all symptoms resolve after 5 days and you have no fever) you can leave your house but continue to wear a mask around others for an additional 5 days. If you have a fever,continue to stay home until you have had 24 hours of no fever. Most cases improve 5-10 days from onset but we have seen a small number of patients who have gotten worse after the 10 days.  Please be sure to watch for worsening symptoms and remain taking the proper precautions.   Go to the nearest hospital ED for assessment if fever/cough/breathlessness are severe or illness seems like a threat to life.    The following symptoms may appear 2-14 days after exposure: Fever Cough Shortness of breath or difficulty breathing Chills Repeated shaking with chills Muscle pain Headache Sore throat New loss of taste or smell Fatigue Congestion or runny nose Nausea or vomiting Diarrhea  You have been enrolled in [facility] for COVID-19. Daily you will receive a questionnaire within the [facility] website. Our COVID-19 response team will be monitoring your responses daily.  You can use medication such as prescription cough medication called Tessalon Perles 100 mg. You may take 1-2 capsules every 8 hours as needed for cough and prescription for Fluticasone nasal spray 2 sprays in each nostril one time per day  For sore throat we recommend warm salt water gargles,  lozenges like Cepacol.   You may also take acetaminophen (Tylenol) as needed for fever and pain.  HOME CARE: Only take medications as instructed by your medical team. Drink plenty of fluids and get plenty of rest. A steam or ultrasonic humidifier can help if you have congestion.   GET HELP RIGHT AWAY IF YOU HAVE EMERGENCY WARNING SIGNS.  Call 911 or proceed to your closest emergency facility if: You develop worsening high fever. Trouble breathing Bluish lips or face Persistent pain or pressure in the chest New confusion Inability to wake or stay awake You cough up blood. Your symptoms become more severe Inability to hold down food or fluids  This list is not all possible symptoms. Contact your medical provider for any symptoms that are severe or concerning to you.    Your e-visit answers were reviewed by a board certified advanced clinical practitioner to complete your personal care plan.  Depending on the condition, your plan could have included both over the counter or prescription medications.  If there is a problem please reply once you have received a response from your provider.  Your safety is important to [place].  If you have drug allergies check your prescription carefully.    You can use MyChart to ask questions about today's visit, request a non-urgent call back, or ask for a work or school excuse for 24 hours related to this e-Visit. If it has been greater than 24 hours you will need  to follow up with your provider, or enter a new e-Visit to address those concerns. You will get an e-mail in the next two days asking about your experience.  I hope that your e-visit has been valuable and will speed your recovery. Thank you for using e-visits.  I spent approximately 10 minutes reviewing the patient's history, current symptoms and coordinating their care today.    Meds ordered this encounter  Medications   fluticasone (FLONASE) 50 MCG/ACT nasal spray    Sig: Place 2 sprays  into both nostrils daily.    Dispense:  16 g    Refill:  6   benzonatate (TESSALON) 100 MG capsule    Sig: Take 1 capsule (100 mg total) by mouth 3 (three) times daily as needed.    Dispense:  30 capsule    Refill:  0

## 2021-01-15 ENCOUNTER — Encounter: Payer: Self-pay | Admitting: Internal Medicine

## 2021-01-15 ENCOUNTER — Ambulatory Visit (INDEPENDENT_AMBULATORY_CARE_PROVIDER_SITE_OTHER): Payer: No Typology Code available for payment source | Admitting: Internal Medicine

## 2021-01-15 ENCOUNTER — Other Ambulatory Visit: Payer: Self-pay

## 2021-01-15 ENCOUNTER — Telehealth: Payer: Self-pay | Admitting: Internal Medicine

## 2021-01-15 ENCOUNTER — Other Ambulatory Visit (HOSPITAL_COMMUNITY): Payer: Self-pay

## 2021-01-15 ENCOUNTER — Ambulatory Visit (HOSPITAL_COMMUNITY)
Admission: RE | Admit: 2021-01-15 | Discharge: 2021-01-15 | Disposition: A | Discharge status: Home / Self Care | Payer: No Typology Code available for payment source | Source: Ambulatory Visit | Attending: Internal Medicine | Admitting: Internal Medicine

## 2021-01-15 VITALS — BP 140/80 | HR 61 | Temp 97.4°F

## 2021-01-15 DIAGNOSIS — R0989 Other specified symptoms and signs involving the circulatory and respiratory systems: Secondary | ICD-10-CM

## 2021-01-15 DIAGNOSIS — R062 Wheezing: Secondary | ICD-10-CM

## 2021-01-15 DIAGNOSIS — R059 Cough, unspecified: Secondary | ICD-10-CM | POA: Diagnosis not present

## 2021-01-15 LAB — CBC WITH DIFFERENTIAL/PLATELET
Absolute Monocytes: 508 cells/uL (ref 200–950)
Basophils Absolute: 39 cells/uL (ref 0–200)
Basophils Relative: 0.5 %
Eosinophils Absolute: 100 cells/uL (ref 15–500)
Eosinophils Relative: 1.3 %
HCT: 46 % (ref 38.5–50.0)
Hemoglobin: 15.8 g/dL (ref 13.2–17.1)
Lymphs Abs: 2610 cells/uL (ref 850–3900)
MCH: 30 pg (ref 27.0–33.0)
MCHC: 34.3 g/dL (ref 32.0–36.0)
MCV: 87.5 fL (ref 80.0–100.0)
MPV: 9.7 fL (ref 7.5–12.5)
Monocytes Relative: 6.6 %
Neutro Abs: 4443 cells/uL (ref 1500–7800)
Neutrophils Relative %: 57.7 %
Platelets: 254 10*3/uL (ref 140–400)
RBC: 5.26 10*6/uL (ref 4.20–5.80)
RDW: 13 % (ref 11.0–15.0)
Total Lymphocyte: 33.9 %
WBC: 7.7 10*3/uL (ref 3.8–10.8)

## 2021-01-15 LAB — POCT INFLUENZA A/B
Influenza A, POC: NEGATIVE
Influenza B, POC: NEGATIVE

## 2021-01-15 LAB — POC COVID19 BINAXNOW: SARS Coronavirus 2 Ag: NEGATIVE

## 2021-01-15 MED ORDER — HYDROCODONE BIT-HOMATROP MBR 5-1.5 MG/5ML PO SOLN
5.0000 mL | Freq: Three times a day (TID) | ORAL | 0 refills | Status: DC | PRN
  Filled 2021-01-15: qty 120, 8d supply, fill #0

## 2021-01-15 MED ORDER — LEVOFLOXACIN 500 MG PO TABS
500.0000 mg | ORAL_TABLET | Freq: Every day | ORAL | 0 refills | Status: DC
  Filled 2021-01-15: qty 10, 10d supply, fill #0

## 2021-01-15 NOTE — Progress Notes (Signed)
° °  Subjective:    Patient ID: Jeffrey Burton, male    DOB: 07/31/1965, 55 y.o.   MRN: [id number]  HPI 55 year old Male seen today for cough and congestion.  He had an ED visit on December 17 and was prescribed Tessalon Perles and Flonase.  He works as a Development worker, community at [employer].  He has had fatigue, malaise, congested cough.  Has not done a COVID test.  We did a rapid flu test and a rapid COVID test today here in the office both of which were negative.   Our records indicate he has not had a COVID booster since 2021.  He has a history of hyperlipidemia but has not wanted to consider statin therapy.  History of lumbar discectomy x2.  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  History of lumbar disc surgery at [facility] in [phone].  History of left lower extremity radiculopathy in 2013 evaluated by MRI and was found to have left L4 disc extrusion and had surgery by Dr. Vertell Limber.  No known drug allergies.  No chronic medications.  Review of Systems denies any recent travel history.  Has some ear congestion.     Objective:   Physical Exam Blood pressure 140/80 pulse 61 temperature 97.4 degrees by ear thermometer pulse oximetry 98%  He does not appear to be tachypneic but has congested cough.  Looks fatigued.  TMs are not red but he has splayed light reflexes bilaterally.  Pharynx is slightly injected but he denies sore throat.  Cough is congested.  He has bilateral rales.       Assessment & Plan:   Suspect bilateral pneumonia  Plan: He is going to have chest [facility] at [facility].  He will need to be out of work for several days and a note will be provided.  He is allergic to doxycycline.  Was started today on Levaquin 500 mg daily for 10 days.  Is to rest and drink plenty of fluids and to watch pulse oximetry at home.  CBC with  differential is drawn today.  Time spent with this patient including reviewing his chart, reviewing electronic visit that he had recently, medical decision making and  e-prescribing medications is 25 minutes.

## 2021-01-15 NOTE — Telephone Encounter (Signed)
Pt called and said that he had the flu last week and that he is doing better now but that he is having trouble sleeping at night if hes laying down because everytime he does he starts getting a crackling feeling and a little bit of a wheeze. He has also been coughing up thick yellow mucus, breathing in deep causes a couging spasm. I offered an appt but he said he would rather something just be called in without an appt. Please advise. Callback [phone].

## 2021-01-15 NOTE — Patient Instructions (Addendum)
Have stat chest x-ray.  Take Levaquin 500 mg daily for 10 days.  Rest and drink plenty of fluids.  Monitor pulse oximetry.  Walk some to help with atelectasis.  Out of work until rechecked on December 27.  May take Hycodan sparingly for cough

## 2021-01-15 NOTE — Telephone Encounter (Signed)
Pt is scheduled for today at 11:45

## 2021-01-16 ENCOUNTER — Telehealth: Payer: Self-pay | Admitting: Internal Medicine

## 2021-01-16 DIAGNOSIS — Z0289 Encounter for other administrative examinations: Secondary | ICD-10-CM

## 2021-01-16 NOTE — Telephone Encounter (Signed)
Faxed Completed FMLA paperwork to [employer] [phone].  Leave Intake [id number]  Out of work 01/15/2021 till 01/23/2021

## 2021-01-23 ENCOUNTER — Ambulatory Visit (INDEPENDENT_AMBULATORY_CARE_PROVIDER_SITE_OTHER): Payer: No Typology Code available for payment source | Admitting: Internal Medicine

## 2021-01-23 ENCOUNTER — Encounter: Payer: Self-pay | Admitting: Internal Medicine

## 2021-01-23 ENCOUNTER — Other Ambulatory Visit: Payer: Self-pay

## 2021-01-23 VITALS — BP 128/82 | HR 64 | Temp 97.8°F | Ht 72.0 in | Wt 250.0 lb

## 2021-01-23 DIAGNOSIS — J22 Unspecified acute lower respiratory infection: Secondary | ICD-10-CM

## 2021-01-23 DIAGNOSIS — R059 Cough, unspecified: Secondary | ICD-10-CM | POA: Diagnosis not present

## 2021-01-23 DIAGNOSIS — R053 Chronic cough: Secondary | ICD-10-CM

## 2021-01-23 MED ORDER — METHYLPREDNISOLONE ACETATE 80 MG/ML IJ SUSP
80.0000 mg | Freq: Once | INTRAMUSCULAR | Status: AC
  Administered 2021-01-23: 12:00:00 80 mg via INTRAMUSCULAR

## 2021-01-23 NOTE — Progress Notes (Signed)
° °  Subjective:    Patient ID: Jeffrey Burton, male    DOB: Jun 01, 1965, 55 y.o.   MRN: [id number]  HPI 55 year old Male here for follow up on lower respiratory infection.  He was seen here on December 19 for cough and congestion.  He had been in the emergency department December 17 and was prescribed Tessalon Perles and Flonase.  He works as a Development worker, community at [employer].  At that time he had fatigue, malaise and congested cough.  We did a rapid flu test and a rapid COVID test both of which were negative.  Records indicate he has not had COVID booster since 2021.  On exam he was not tachypneic but had congested cough.  Bilateral rales were heard on chest auscultation.  He was suspected of having bacterial pneumonia.  He had a chest x-ray that showed no active cardiopulmonary disease.  He was taken out of work.  Started on Levaquin 500 mg for 10 days.  Was told to monitor pulse oximetry and to rest and drink plenty of fluids.  He is now here for recheck.  He was given Hycodan to take sparingly for cough.  Patient reports that he is feeling better.  Says he realized that he was fairly ill and was glad he had time off work.  Still has slight cough and is not 100% well.  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  He is looking forward to that new position.    Review of Systems still coughing but not productive. Throat is scrtachy.     Objective:   Physical Exam He is afebrile.  Blood pressure 128/82 pulse 64 pulse oximetry 98% weight 250 pounds BMI 33.91 Chest is completely clear to auscultation without any rales or wheezing.      Assessment & Plan:  Acute lower respiratory infection-resolved.  Tested negative for COVID-19 and influenza A here at the office.  Still having some cough.  Given Depo-Medrol 80 mg IM in office today.  Plan: May return to work without restrictions.

## 2021-01-23 NOTE — Patient Instructions (Addendum)
We are glad you are feeling better at this point.  May return to work without restrictions.  Still having some cough.  Given Depo-Medrol 80 mg IM.

## 2021-08-02 ENCOUNTER — Telehealth: Payer: Self-pay | Admitting: Internal Medicine

## 2021-08-02 NOTE — Telephone Encounter (Signed)
Jeffrey Burton [phone]  [name] LVM about wanting to schedule CPE.  I returned call for him to CB to schedule, however he can not schedule until after 09/08/2021.

## 2021-08-06 NOTE — Telephone Encounter (Signed)
Jeffrey Burton and scheduled CPE

## 2021-09-17 ENCOUNTER — Other Ambulatory Visit: Payer: No Typology Code available for payment source

## 2021-09-17 DIAGNOSIS — N50812 Left testicular pain: Secondary | ICD-10-CM

## 2021-09-17 DIAGNOSIS — E7800 Pure hypercholesterolemia, unspecified: Secondary | ICD-10-CM

## 2021-09-18 LAB — COMPLETE METABOLIC PANEL WITH GFR
AG Ratio: 1.9 (calc) (ref 1.0–2.5)
ALT: 31 U/L (ref 9–46)
AST: 25 U/L (ref 10–35)
Albumin: 4.2 g/dL (ref 3.6–5.1)
Alkaline phosphatase (APISO): 61 U/L (ref 35–144)
BUN: 12 mg/dL (ref 7–25)
CO2: 23 mmol/L (ref 20–32)
Calcium: 9.5 mg/dL (ref 8.6–10.3)
Chloride: 103 mmol/L (ref 98–110)
Creat: 1 mg/dL (ref 0.70–1.30)
Globulin: 2.2 g/dL (calc) (ref 1.9–3.7)
Glucose, Bld: 98 mg/dL (ref 65–99)
Potassium: 4.6 mmol/L (ref 3.5–5.3)
Sodium: 137 mmol/L (ref 135–146)
Total Bilirubin: 0.8 mg/dL (ref 0.2–1.2)
Total Protein: 6.4 g/dL (ref 6.1–8.1)
eGFR: 88 mL/min/{1.73_m2} (ref >=60)

## 2021-09-18 LAB — CBC WITH DIFFERENTIAL/PLATELET
Absolute Monocytes: 606 cells/uL (ref 200–950)
Basophils Absolute: 78 cells/uL (ref 0–200)
Basophils Relative: 1.3 %
Eosinophils Absolute: 102 cells/uL (ref 15–500)
Eosinophils Relative: 1.7 %
HCT: 43.8 % (ref 38.5–50.0)
Hemoglobin: 15.1 g/dL (ref 13.2–17.1)
Lymphs Abs: 2250 cells/uL (ref 850–3900)
MCH: 29.6 pg (ref 27.0–33.0)
MCHC: 34.5 g/dL (ref 32.0–36.0)
MCV: 85.9 fL (ref 80.0–100.0)
MPV: 9.5 fL (ref 7.5–12.5)
Monocytes Relative: 10.1 %
Neutro Abs: 2964 cells/uL (ref 1500–7800)
Neutrophils Relative %: 49.4 %
Platelets: 260 10*3/uL (ref 140–400)
RBC: 5.1 10*6/uL (ref 4.20–5.80)
RDW: 12.7 % (ref 11.0–15.0)
Total Lymphocyte: 37.5 %
WBC: 6 10*3/uL (ref 3.8–10.8)

## 2021-09-18 LAB — LIPID PANEL
Cholesterol: 212 mg/dL — ABNORMAL HIGH (ref <200)
HDL: 70 mg/dL (ref >=40)
LDL Cholesterol (Calc): 124 mg/dL (calc) — ABNORMAL HIGH
Non-HDL Cholesterol (Calc): 142 mg/dL (calc) — ABNORMAL HIGH (ref <130)
Total CHOL/HDL Ratio: 3 (calc) (ref <5.0)
Triglycerides: 79 mg/dL (ref <150)

## 2021-09-18 LAB — PSA: PSA: 2.24 ng/mL (ref <=4.00)

## 2021-09-24 ENCOUNTER — Encounter: Payer: Self-pay | Admitting: Internal Medicine

## 2021-09-24 ENCOUNTER — Ambulatory Visit (INDEPENDENT_AMBULATORY_CARE_PROVIDER_SITE_OTHER): Payer: No Typology Code available for payment source | Admitting: Internal Medicine

## 2021-09-24 VITALS — BP 124/78 | HR 74 | Temp 98.0°F | Ht 72.25 in | Wt 256.0 lb

## 2021-09-24 DIAGNOSIS — Z6834 Body mass index (BMI) 34.0-34.9, adult: Secondary | ICD-10-CM

## 2021-09-24 DIAGNOSIS — Z9889 Other specified postprocedural states: Secondary | ICD-10-CM

## 2021-09-24 DIAGNOSIS — E7800 Pure hypercholesterolemia, unspecified: Secondary | ICD-10-CM | POA: Diagnosis not present

## 2021-09-24 DIAGNOSIS — Z0000 Encounter for general adult medical examination without abnormal findings: Secondary | ICD-10-CM | POA: Diagnosis not present

## 2021-09-24 LAB — POCT URINALYSIS DIPSTICK
Bilirubin, UA: NEGATIVE
Blood, UA: NEGATIVE
Glucose, UA: NEGATIVE
Ketones, UA: NEGATIVE
Leukocytes, UA: NEGATIVE
Nitrite, UA: NEGATIVE
Protein, UA: NEGATIVE
Spec Grav, UA: 1.01 (ref 1.010–1.025)
Urobilinogen, UA: 0.2 E.U./dL
pH, UA: 7 (ref 5.0–8.0)

## 2021-09-24 NOTE — Progress Notes (Signed)
Subjective:    Patient ID: Jeffrey Burton, male    DOB: 1965/12/24, 56 y.o.   MRN: [id number]  HPI 56 year old Male seen for health maintenance exam and evaluation of medical issues.Vaccines discussed including: Shingrix, pneumococcal 20, covid booster, will receive flu vaccine through work, RSV vaccine.  He has a history of pure hypercholesterolemia but has a good HDL cholesterol of 70.  In November 2022 his total cholesterol was 250 and is now 212.  LDL cholesterol was 141 in 2022 and is now 124.  Triglycerides are normal.  He will continue with diet and exercise regimen.  He does not really want to be on statin medication but should think about it.  Coronary calcium scoring ordered.  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  He likes his job.  The commute from his home in [place] is not very far.  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  He had lumbar disc surgery at [facility] in [place] but cannot remember the exact level.  These records are not available in care everywhere.  He subsequently had left lower extremity radiculopathy in 2013 and was found to have a left disc extrusion L4-L5 and had surgery by Dr. Vertell Limber and has done well.  History of fractured clavicle in the remote past.  No chronic medications.  No known drug allergies.  Social history: He is married.  Completed 2 years of college.  He is a native of [place] and previously served in the [employer] in [place].  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  He worked for [employer] for a time before becoming a Development worker, community.  Family history: Father with history of hyperlipidemia and hypertension still living in his 71s.  Mother died of metastatic breast cancer.  1 brother in his  63s with history of hypertension and hyperlipidemia.  He has a son who was overweight and has had issues with elevated blood pressure and hyperlipidemia but has lost weight over the past few years.  He was walking back to his car from work at [employer] in 2020 on the afternoon of November 17.  He stepped into a  grate in the parking lot which was not secured and fell into a hole striking and injuring his left ankle.  [facility] taken to [facility] where an x-ray of the left ankle was negative for fracture and he subsequently improved.  Had colonoscopy in 2022 by Dr. Carlean Purl due to family history of colon cancer.  He had 2 adenomatous polyps and a hyperplastic polyp.  Recall is in 2027.  PSA is 2.24.  CBC and c-Met are normal.    Review of Systems no new complaints-feels well     Objective:   Physical Exam Vital signs reviewed and are stable.  Skin: Warm and dry.  Nodes none.  TMs clear.  Pharynx clear.  Neck is supple without JVD thyromegaly or carotid bruits.  Chest is clear to auscultation.  Cardiac exam: Regular rate and rhythm without murmur or ectopy.  Abdomen soft nondistended without hepatosplenomegaly masses or tenderness.  Prostate is normal without nodules.  No lower extremity pitting edema.  Neuro is intact without gross focal deficits.  Affect thought and judgment are normal.  Assessment & Plan:  History of lumbar discectomy x2  History of Morton's neuroma right foot  History of fractured clavicle in the remote past  Pure hypercholesterolemia-to have coronary calcium scoring  Healthcare maintenance-vaccines discussed for the upcoming Fall flu season  Plan: Return in 1 year or as needed.  Coronary calcium score to be done in the near future.  He was given phone number to call for Central scheduling.  Watch diet and lose some weight.

## 2021-10-06 NOTE — Patient Instructions (Signed)
Try to lose some weight.  Its been creeping up over the past year.  Try to get more exercise.  Vaccines discussed.  To have coronary calcium scoring.  May need to be on statin medication.  It was a pleasure to see you today.  Follow-up in 1 year or as needed.

## 2021-10-11 ENCOUNTER — Telehealth: Payer: Self-pay | Admitting: Internal Medicine

## 2021-10-11 NOTE — Telephone Encounter (Signed)
Jeffrey Burton [phone]  Rich called to say he had returned from [place] in [place] last Friday and his ears have not popped since returning, they have been hurting, sound muffled. He has cleaned them, put drops in them and he does not know what else to do. Would like to come in tomorrow for to you look and see if he might have infection or see what is going on. I scheduled him, since he is off tomorrow.

## 2021-10-12 ENCOUNTER — Other Ambulatory Visit (HOSPITAL_BASED_OUTPATIENT_CLINIC_OR_DEPARTMENT_OTHER): Payer: Self-pay

## 2021-10-12 ENCOUNTER — Encounter: Payer: Self-pay | Admitting: Internal Medicine

## 2021-10-12 ENCOUNTER — Ambulatory Visit (INDEPENDENT_AMBULATORY_CARE_PROVIDER_SITE_OTHER): Payer: No Typology Code available for payment source | Admitting: Internal Medicine

## 2021-10-12 VITALS — BP 122/74 | HR 63 | Temp 98.0°F | Ht 72.25 in | Wt 257.8 lb

## 2021-10-12 DIAGNOSIS — H6693 Otitis media, unspecified, bilateral: Secondary | ICD-10-CM

## 2021-10-15 ENCOUNTER — Other Ambulatory Visit (HOSPITAL_BASED_OUTPATIENT_CLINIC_OR_DEPARTMENT_OTHER): Payer: Self-pay

## 2021-10-15 ENCOUNTER — Telehealth: Payer: Self-pay | Admitting: Internal Medicine

## 2021-10-15 MED ORDER — LEVOFLOXACIN 500 MG PO TABS: 500.0000 mg | ORAL_TABLET | Freq: Every day | ORAL | 0 refills | Status: AC

## 2021-10-15 MED ORDER — METHYLPREDNISOLONE 4 MG PO TBPK: ORAL_TABLET | ORAL | 0 refills | Status: DC

## 2021-10-15 NOTE — Telephone Encounter (Signed)
His wife said that a antibiotic was called in but not a steroid. He was expecting both. He is really sick today.

## 2021-10-15 NOTE — Progress Notes (Unsigned)
   Subjective:    Patient ID: Jeffrey Burton, male    DOB: 1966-01-19, 56 y.o.   MRN: [id number]  HPI Patient went to a resort in [place] traveling via airplane and was gone about a week. He returned home Friday September 8th and ears still feel congested, he cleaned out the canals and used some ear drops.    Review of Systems no fever, chills, nausea vomiting, headache sore throat, diarrhea     Objective:   Physical Exam  VS reviewed.   Canals are clean. TMs bilaterally show splayed light reflex and slight  diffuse redness. No drainage or perforation in ears bilaterally. Neck supple Pharynx clear. Chest clear.      Assessment & Plan:   Acute otitis media- bilateral  Plan: Levaquin 500 mg daily for 7 days. Medrol 4 mg Dosepak to start with 6 tabs Day 1  and tapering by one tab daily 6-5-4-3-2-1

## 2021-10-15 NOTE — Telephone Encounter (Signed)
Need to send in medrol dose pack in addition to antibiotic. This will be done now, MJB, MD

## 2021-10-15 NOTE — Telephone Encounter (Signed)
Jeffrey Burton called and he has not received his medication from his appointment on Friday. He would like them called into the [facility] in [place].

## 2021-10-26 ENCOUNTER — Ambulatory Visit (INDEPENDENT_AMBULATORY_CARE_PROVIDER_SITE_OTHER): Payer: No Typology Code available for payment source | Admitting: Internal Medicine

## 2021-10-26 ENCOUNTER — Encounter: Payer: Self-pay | Admitting: Internal Medicine

## 2021-10-26 VITALS — BP 118/80 | HR 55 | Temp 97.4°F | Ht 72.25 in | Wt 254.8 lb

## 2021-10-26 DIAGNOSIS — H6503 Acute serous otitis media, bilateral: Secondary | ICD-10-CM | POA: Diagnosis not present

## 2021-10-26 DIAGNOSIS — H9209 Otalgia, unspecified ear: Secondary | ICD-10-CM

## 2021-10-26 MED ORDER — CEFTRIAXONE SODIUM 1 G IJ SOLR
1.0000 g | Freq: Once | INTRAMUSCULAR | Status: AC
  Administered 2021-10-26: 1 g via INTRAMUSCULAR

## 2021-10-26 MED ORDER — METHYLPREDNISOLONE ACETATE 80 MG/ML IJ SUSP: 80.0000 mg | Freq: Once | INTRAMUSCULAR | Status: DC

## 2021-10-26 MED ORDER — CLARITHROMYCIN 500 MG PO TABS: 500.0000 mg | ORAL_TABLET | Freq: Two times a day (BID) | ORAL | 0 refills | Status: DC

## 2021-10-26 MED ORDER — METHYLPREDNISOLONE ACETATE 80 MG/ML IJ SUSP
80.0000 mg | Freq: Once | INTRAMUSCULAR | Status: AC
  Administered 2021-10-26: 80 mg via INTRAMUSCULAR

## 2021-10-26 NOTE — Progress Notes (Signed)
   Subjective:    Patient ID: Jeffrey Burton, male    DOB: May 07, 1965, 56 y.o.   MRN: [id number]  HPI He returned from a vacation to [place] around Sept. 8. He traveled there by airplane and spent time swimming. He was able able to get his ears to  clear after arriving home.  He cleaned his ears out and put drops in them and he was not able to get any relief.  He has had no fever chills, nausea or vomiting. Says he has never had ears that were stopped up for this amount of time.  Has no drainage from external ear canals.  Was initially seen on September 15 and treated with Levaquin 500 mg daily for 7 days.  We overlooked calling in a Medrol Dosepak at the time and it was done on September 18.  He finished Medrol Dosepak and Levaquin.  He called today saying he still had clogged ears and office visit was advised.  His general health is excellent.  He has a history of pure hypercholesterolemia.  He works as a Development worker, community.  He takes no chronic medications and has no known drug allergies.  He had a health maintenance exam on August 28 and his labs were stable.   Review of Systems see above     Objective:   Physical Exam  Vital signs are reviewed.  Temperature 97.4 degrees by ear thermometer.  Pulse 55 blood pressure 118/80 BMI 34.32. Both TMs appear to be full and slightly pink but not red.  His pharynx is clear.  His neck is supple.  No cervical adenopathy.  Chest clear.  There are splayed light reflexes bilaterally.     Assessment & Plan:  Persistent bilateral serous otitis media.  He has splayed light reflexes bilaterally.  The TMs are not full.  He does not appear to be in extreme discomfort.  Plan:  He may take a decongestant such as Sudafed or DayQuil if he so desires.  He is to let me know if not improving within a few days.  He was given 1 g IM ceftriaxone and started on Biaxin 500 mg twice daily for 10 days.  He was also given Depo-Medrol 80 mg IM.  If he does not improve  with this regimen he will be referred to ENT physician.

## 2021-10-26 NOTE — Telephone Encounter (Signed)
Patient scheduled.

## 2021-10-26 NOTE — Telephone Encounter (Signed)
Jeffrey Burton called to say his right ear is still blocked and muffled, does he need to come back or see ENT?

## 2021-10-26 NOTE — Telephone Encounter (Signed)
LVM to CB that we could see him today

## 2021-10-26 NOTE — Telephone Encounter (Signed)
LVM for patient to CB.

## 2021-10-26 NOTE — Patient Instructions (Addendum)
Biaxin 500 mg twice daily for 10 days.  1 g IM ceftriaxone given in office.  May take over-the-counter decongestant.  If not improving within a few days he will be referred to ENT physician.  He was also given Depo-Medrol 80 mg IM today.

## 2021-10-27 NOTE — Patient Instructions (Signed)
Take Levaquin 500 mg daily for 7 days.  Take tapering course of Medrol 4 mg tablets starting with 6 tablets day 1 and decreasing by 1 tablet daily.

## 2022-05-16 ENCOUNTER — Telehealth: Payer: Self-pay | Admitting: Internal Medicine

## 2022-05-16 NOTE — Telephone Encounter (Signed)
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 He would like to come in and see you to discuss. Off on Mondays.

## 2022-05-21 NOTE — Telephone Encounter (Signed)
Left voicemail for patient to call the office to schedule an appt.  

## 2022-05-24 NOTE — Telephone Encounter (Signed)
LVM to CB to schedule an appointment 

## 2022-05-28 DIAGNOSIS — M5412 Radiculopathy, cervical region: Secondary | ICD-10-CM | POA: Diagnosis not present

## 2022-05-28 DIAGNOSIS — M542 Cervicalgia: Secondary | ICD-10-CM | POA: Diagnosis not present

## 2022-05-28 DIAGNOSIS — M9901 Segmental and somatic dysfunction of cervical region: Secondary | ICD-10-CM | POA: Diagnosis not present

## 2022-05-28 DIAGNOSIS — M25512 Pain in left shoulder: Secondary | ICD-10-CM | POA: Diagnosis not present

## 2022-05-28 NOTE — Telephone Encounter (Signed)
scheduled

## 2022-05-30 DIAGNOSIS — M5412 Radiculopathy, cervical region: Secondary | ICD-10-CM | POA: Diagnosis not present

## 2022-05-30 DIAGNOSIS — M9901 Segmental and somatic dysfunction of cervical region: Secondary | ICD-10-CM | POA: Diagnosis not present

## 2022-05-30 DIAGNOSIS — M25512 Pain in left shoulder: Secondary | ICD-10-CM | POA: Diagnosis not present

## 2022-05-30 DIAGNOSIS — M542 Cervicalgia: Secondary | ICD-10-CM | POA: Diagnosis not present

## 2022-06-03 ENCOUNTER — Ambulatory Visit (INDEPENDENT_AMBULATORY_CARE_PROVIDER_SITE_OTHER): Payer: 59 | Admitting: Internal Medicine

## 2022-06-03 ENCOUNTER — Other Ambulatory Visit: Payer: 59

## 2022-06-03 VITALS — BP 128/82 | HR 61 | Temp 97.9°F | Ht 72.25 in | Wt 255.0 lb

## 2022-06-03 DIAGNOSIS — M19049 Primary osteoarthritis, unspecified hand: Secondary | ICD-10-CM | POA: Diagnosis not present

## 2022-06-03 DIAGNOSIS — M542 Cervicalgia: Secondary | ICD-10-CM | POA: Diagnosis not present

## 2022-06-03 DIAGNOSIS — Z0000 Encounter for general adult medical examination without abnormal findings: Secondary | ICD-10-CM | POA: Diagnosis not present

## 2022-06-03 DIAGNOSIS — M5412 Radiculopathy, cervical region: Secondary | ICD-10-CM | POA: Diagnosis not present

## 2022-06-03 DIAGNOSIS — M2550 Pain in unspecified joint: Secondary | ICD-10-CM | POA: Diagnosis not present

## 2022-06-03 DIAGNOSIS — R768 Other specified abnormal immunological findings in serum: Secondary | ICD-10-CM | POA: Diagnosis not present

## 2022-06-03 DIAGNOSIS — M25512 Pain in left shoulder: Secondary | ICD-10-CM | POA: Diagnosis not present

## 2022-06-03 DIAGNOSIS — M9901 Segmental and somatic dysfunction of cervical region: Secondary | ICD-10-CM | POA: Diagnosis not present

## 2022-06-03 MED ORDER — MELOXICAM 15 MG PO TABS
15.0000 mg | ORAL_TABLET | Freq: Every day | ORAL | 0 refills | Status: DC
Start: 2022-06-03 — End: 2022-07-09

## 2022-06-03 NOTE — Progress Notes (Signed)
Labs ordered and drawn.

## 2022-06-03 NOTE — Patient Instructions (Addendum)
Referral to Dr. Amanda Pea for evaluation of left hand arthritis second third and fourth MCP joints. CPE due in September but has had labs today for that CPE

## 2022-06-03 NOTE — Progress Notes (Signed)
     Subjective:    Patient ID: Jeffrey Burton, male    DOB: 1966/01/21, 57 y.o.   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 By the end of the day is in pain and is fatigued. Joints are not hot but has noticed decreased ROM due to pain and swelling. No recent injury to hand that he is aware of and other joints are not hurting. He is worried about this since this is essential to his job.  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  He had lumbar disc surgery at [facility] in [place] but cannot remember the exact level.  We could not find the records in care everywhere.  Subsequently had left lower extremity radiculopathy in 2013 and was found to have left disc extrusion L4-L5 and had surgery by Dr. Venetia Maxon and did well.  History of fractured clavicle in the remote past.  No chronic medications.  No known drug allergies.  History of pure hypercholesterolemia but has not wanted to be on statin medication.  Had excellent HDL of 70 when checked in August 2023.  Normal fasting glucose at that time as well.  Normal BUN and creatinine.  Social history: He is married.  Completed 2 years of college.  He is a native of [address] and previously served in the [employer] in [place].  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  Family history: Father with history of hyperlipidemia and hypertension still living in his 30s.  Mother died of metastatic breast cancer.  1 brother in his 62s with a history of  hypertension and hyperlipidemia.  Had colonoscopy by Dr. Leone Payor in 2022.  He had 2 adenomatous polyps and a hyperplastic polyp.  Recall is in 2027.   Review of Systems see above     Objective:   Physical Exam Blood pressure 128/82, pulse 61, temperature 97.9 degrees, pulse oximetry 98%, weight 255 pounds, BMI 34.35  He has prominent MCP joints left second, third, and fourth fingers.  Hand is not warm to touch.  No visible erythema.      Assessment & Plan:  Hand arthritis  Plan: Rheumatology studies drawn.  Referral to Dr. Amanda Pea, hand surgeon for evaluation.  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  Will defer x-rays to Dr. Amanda Pea.   Addendum: ANA titer is low positive nuclear/homogeneous pattern and unlikely to being affecting this issue at this titer. Does not have lupus. Anti-DNA is negative.Sed rate is 2, RF less than 10

## 2022-06-05 DIAGNOSIS — M542 Cervicalgia: Secondary | ICD-10-CM | POA: Diagnosis not present

## 2022-06-05 DIAGNOSIS — M9901 Segmental and somatic dysfunction of cervical region: Secondary | ICD-10-CM | POA: Diagnosis not present

## 2022-06-05 DIAGNOSIS — M25512 Pain in left shoulder: Secondary | ICD-10-CM | POA: Diagnosis not present

## 2022-06-05 DIAGNOSIS — M5412 Radiculopathy, cervical region: Secondary | ICD-10-CM | POA: Diagnosis not present

## 2022-06-06 ENCOUNTER — Other Ambulatory Visit: Payer: Self-pay

## 2022-06-06 DIAGNOSIS — M9901 Segmental and somatic dysfunction of cervical region: Secondary | ICD-10-CM | POA: Diagnosis not present

## 2022-06-06 DIAGNOSIS — R768 Other specified abnormal immunological findings in serum: Secondary | ICD-10-CM

## 2022-06-06 DIAGNOSIS — M5412 Radiculopathy, cervical region: Secondary | ICD-10-CM | POA: Diagnosis not present

## 2022-06-06 DIAGNOSIS — M542 Cervicalgia: Secondary | ICD-10-CM | POA: Diagnosis not present

## 2022-06-06 DIAGNOSIS — M25512 Pain in left shoulder: Secondary | ICD-10-CM | POA: Diagnosis not present

## 2022-06-06 LAB — TEST AUTHORIZATION

## 2022-06-06 LAB — ANTI-NUCLEAR AB-TITER (ANA TITER): ANA Titer 1: 1:40 {titer} — ABNORMAL HIGH

## 2022-06-07 LAB — CBC WITH DIFFERENTIAL/PLATELET
Absolute Monocytes: 616 cells/uL (ref 200–950)
Basophils Absolute: 80 cells/uL (ref 0–200)
Basophils Relative: 1.2 %
Eosinophils Absolute: 80 cells/uL (ref 15–500)
Eosinophils Relative: 1.2 %
HCT: 48.6 % (ref 38.5–50.0)
Hemoglobin: 16.2 g/dL (ref 13.2–17.1)
Lymphs Abs: 2338 cells/uL (ref 850–3900)
MCH: 28.9 pg (ref 27.0–33.0)
MCHC: 33.3 g/dL (ref 32.0–36.0)
MCV: 86.8 fL (ref 80.0–100.0)
MPV: 9.5 fL (ref 7.5–12.5)
Monocytes Relative: 9.2 %
Neutro Abs: 3585 cells/uL (ref 1500–7800)
Neutrophils Relative %: 53.5 %
Platelets: 296 10*3/uL (ref 140–400)
RBC: 5.6 10*6/uL (ref 4.20–5.80)
RDW: 13 % (ref 11.0–15.0)
Total Lymphocyte: 34.9 %
WBC: 6.7 10*3/uL (ref 3.8–10.8)

## 2022-06-07 LAB — COMPLETE METABOLIC PANEL WITH GFR
AG Ratio: 1.6 (calc) (ref 1.0–2.5)
ALT: 31 U/L (ref 9–46)
AST: 24 U/L (ref 10–35)
Albumin: 4.6 g/dL (ref 3.6–5.1)
Alkaline phosphatase (APISO): 61 U/L (ref 35–144)
BUN: 14 mg/dL (ref 7–25)
CO2: 27 mmol/L (ref 20–32)
Calcium: 10.2 mg/dL (ref 8.6–10.3)
Chloride: 102 mmol/L (ref 98–110)
Creat: 0.88 mg/dL (ref 0.70–1.30)
Globulin: 2.9 g/dL (calc) (ref 1.9–3.7)
Glucose, Bld: 97 mg/dL (ref 65–139)
Potassium: 5.2 mmol/L (ref 3.5–5.3)
Sodium: 138 mmol/L (ref 135–146)
Total Bilirubin: 0.8 mg/dL (ref 0.2–1.2)
Total Protein: 7.5 g/dL (ref 6.1–8.1)
eGFR: 100 mL/min/{1.73_m2} (ref >=60)

## 2022-06-07 LAB — LIPID PANEL
Cholesterol: 254 mg/dL — ABNORMAL HIGH (ref <200)
HDL: 89 mg/dL (ref >=40)
LDL Cholesterol (Calc): 138 mg/dL (calc) — ABNORMAL HIGH
Non-HDL Cholesterol (Calc): 165 mg/dL (calc) — ABNORMAL HIGH (ref <130)
Total CHOL/HDL Ratio: 2.9 (calc) (ref <5.0)
Triglycerides: 138 mg/dL (ref <150)

## 2022-06-07 LAB — SEDIMENTATION RATE: Sed Rate: 2 mm/h (ref 0–20)

## 2022-06-07 LAB — TEST AUTHORIZATION

## 2022-06-07 LAB — ANTI-DNA ANTIBODY, DOUBLE-STRANDED: ds DNA Ab: <1 IU/mL

## 2022-06-07 LAB — RHEUMATOID FACTOR: Rheumatoid fact SerPl-aCnc: <10 IU/mL (ref <14)

## 2022-06-07 LAB — PSA: PSA: 1.98 ng/mL (ref <=4.00)

## 2022-06-07 LAB — ANA: Anti Nuclear Antibody (ANA): POSITIVE — AB

## 2022-06-07 LAB — CYCLIC CITRUL PEPTIDE ANTIBODY, IGG: Cyclic Citrullin Peptide Ab: <16 UNITS

## 2022-06-12 DIAGNOSIS — M5412 Radiculopathy, cervical region: Secondary | ICD-10-CM | POA: Diagnosis not present

## 2022-06-12 DIAGNOSIS — M25512 Pain in left shoulder: Secondary | ICD-10-CM | POA: Diagnosis not present

## 2022-06-12 DIAGNOSIS — M9901 Segmental and somatic dysfunction of cervical region: Secondary | ICD-10-CM | POA: Diagnosis not present

## 2022-06-12 DIAGNOSIS — M542 Cervicalgia: Secondary | ICD-10-CM | POA: Diagnosis not present

## 2022-06-13 DIAGNOSIS — M9901 Segmental and somatic dysfunction of cervical region: Secondary | ICD-10-CM | POA: Diagnosis not present

## 2022-06-13 DIAGNOSIS — M25512 Pain in left shoulder: Secondary | ICD-10-CM | POA: Diagnosis not present

## 2022-06-13 DIAGNOSIS — M542 Cervicalgia: Secondary | ICD-10-CM | POA: Diagnosis not present

## 2022-06-13 DIAGNOSIS — M5412 Radiculopathy, cervical region: Secondary | ICD-10-CM | POA: Diagnosis not present

## 2022-06-19 DIAGNOSIS — M19049 Primary osteoarthritis, unspecified hand: Secondary | ICD-10-CM | POA: Insufficient documentation

## 2022-06-25 DIAGNOSIS — M542 Cervicalgia: Secondary | ICD-10-CM | POA: Diagnosis not present

## 2022-06-25 DIAGNOSIS — M5412 Radiculopathy, cervical region: Secondary | ICD-10-CM | POA: Diagnosis not present

## 2022-06-25 DIAGNOSIS — M9901 Segmental and somatic dysfunction of cervical region: Secondary | ICD-10-CM | POA: Diagnosis not present

## 2022-06-25 DIAGNOSIS — M25512 Pain in left shoulder: Secondary | ICD-10-CM | POA: Diagnosis not present

## 2022-06-26 DIAGNOSIS — M25512 Pain in left shoulder: Secondary | ICD-10-CM | POA: Diagnosis not present

## 2022-06-26 DIAGNOSIS — M542 Cervicalgia: Secondary | ICD-10-CM | POA: Diagnosis not present

## 2022-06-26 DIAGNOSIS — M5412 Radiculopathy, cervical region: Secondary | ICD-10-CM | POA: Diagnosis not present

## 2022-06-26 DIAGNOSIS — M9901 Segmental and somatic dysfunction of cervical region: Secondary | ICD-10-CM | POA: Diagnosis not present

## 2022-07-01 DIAGNOSIS — M9901 Segmental and somatic dysfunction of cervical region: Secondary | ICD-10-CM | POA: Diagnosis not present

## 2022-07-01 DIAGNOSIS — M5412 Radiculopathy, cervical region: Secondary | ICD-10-CM | POA: Diagnosis not present

## 2022-07-01 DIAGNOSIS — M542 Cervicalgia: Secondary | ICD-10-CM | POA: Diagnosis not present

## 2022-07-01 DIAGNOSIS — M25512 Pain in left shoulder: Secondary | ICD-10-CM | POA: Diagnosis not present

## 2022-07-02 DIAGNOSIS — M9901 Segmental and somatic dysfunction of cervical region: Secondary | ICD-10-CM | POA: Diagnosis not present

## 2022-07-02 DIAGNOSIS — M542 Cervicalgia: Secondary | ICD-10-CM | POA: Diagnosis not present

## 2022-07-02 DIAGNOSIS — M5412 Radiculopathy, cervical region: Secondary | ICD-10-CM | POA: Diagnosis not present

## 2022-07-02 DIAGNOSIS — M25512 Pain in left shoulder: Secondary | ICD-10-CM | POA: Diagnosis not present

## 2022-07-09 ENCOUNTER — Other Ambulatory Visit: Payer: Self-pay | Admitting: Internal Medicine

## 2022-07-09 DIAGNOSIS — M19049 Primary osteoarthritis, unspecified hand: Secondary | ICD-10-CM

## 2022-08-26 DIAGNOSIS — M1812 Unilateral primary osteoarthritis of first carpometacarpal joint, left hand: Secondary | ICD-10-CM | POA: Diagnosis not present

## 2022-08-26 DIAGNOSIS — M79641 Pain in right hand: Secondary | ICD-10-CM | POA: Diagnosis not present

## 2022-08-26 DIAGNOSIS — M79642 Pain in left hand: Secondary | ICD-10-CM | POA: Diagnosis not present

## 2022-09-11 ENCOUNTER — Telehealth: Payer: Self-pay

## 2022-09-11 ENCOUNTER — Telehealth (INDEPENDENT_AMBULATORY_CARE_PROVIDER_SITE_OTHER): Payer: 59 | Admitting: Internal Medicine

## 2022-09-11 ENCOUNTER — Encounter: Payer: Self-pay | Admitting: Internal Medicine

## 2022-09-11 DIAGNOSIS — U071 COVID-19: Secondary | ICD-10-CM | POA: Diagnosis not present

## 2022-09-11 MED ORDER — NIRMATRELVIR/RITONAVIR (PAXLOVID)TABLET: 3.0000 | ORAL_TABLET | Freq: Two times a day (BID) | ORAL | 0 refills | Status: AC

## 2022-09-11 NOTE — Telephone Encounter (Signed)
scheduled

## 2022-09-11 NOTE — Patient Instructions (Addendum)
Patient has a normal GFR.  Regular strength Paxlovid has been sent in for him by E scribe.  He is to quarantine at home for total of 5 days.  Advised to walk some to prevent atelectasis.  Stay well-hydrated.  Monitor pulse oximetry if pulse oximeter is available.  Call if not improving within 48 hours or sooner if worse.  May take Tylenol, Aleve or Advil for fever.

## 2022-09-11 NOTE — Telephone Encounter (Signed)
Patient called he thinks he has COVID, he is congested runny nose and has diarrhea.  He does not have a COVID test at home.

## 2022-09-11 NOTE — Progress Notes (Signed)
   Subjective:    Patient ID: Jeffrey Burton, male    DOB: Nov 02, 1965, 57 y.o.   MRN: [id number]  HPI 12:35 PM: I connected today via interactive audio and video telecommunications with Duke Salvia. Garten, a patient in this practice.  He is at his home and I am at my office.  He is agreeable to visit in this format today.    He tells me that he has myalgias, diarrhea, headache, respiratory congestion and thick sputum production.  Temperature is 99.1 degrees.  He has had symptoms since Monday, August 12.  He did have guests in his home from [place] but they did not have symptoms of illness.  Doreene Adas has had respiratory symptoms.  Currently patient does not have access to COVID-19 testing.  He has been out of work since Monday.  He is going to go to the pharmacy and get a COVID test and call me back.  Past medical history: He has a history of pure hypercholesterolemia.  His general health is excellent.  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  Married.  Family history: Father with history of hyperlipidemia and hypertension.  Mother died of metastatic breast cancer.  1 brother with history of hypertension and hyperlipidemia.    Review of Systems myalgias, considerable amount of diarrhea, headache, respiratory congestion and thick yellow mucus.     Objective:   Physical Exam Patient is alert and oriented x 3 and gives a clear concise history.  Reports temperature 99.1 degrees.  He is seen virtually at his home.  No audible wheezing.  No observed respiratory distress.       Assessment & Plan:  Acute viral syndrome-possible COVID-19 virus infection  Plan: Patient will go obtain COVID-19 test from pharmacy and perform a COVID-19 test and call us with results.  He is agreeable to taking Paxlovid.  He was advised if he is COVID-positive he needs to remain out of work a total of 5 days from onset of symptoms and be afebrile and feeling much better before he returns to  work.   Addenedum: Patient called back around 1:10 pm saying he had gotten a Covid test from pharmacy and the result was positive. He wants to take Paxlovid. His GFR is normal. We have e-scribed regular strength Paxlovid for him with instructions to take as directed, Quarantine at home for a total of 5 days, walk some to prevent atelectasis.Call back if questions or concerns.

## 2022-09-16 ENCOUNTER — Other Ambulatory Visit: Payer: No Typology Code available for payment source

## 2022-09-16 NOTE — Progress Notes (Shared)
Patient Care Team: Margaree Mackintosh, MD as PCP - General (Internal Medicine)  Visit Date: 09/16/22  Subjective:    Patient ID: Jeffrey Burton , Male   DOB: [birth year], 57 y.o.    MRN: [id number]   57 y.o. Male presents today for annual comprehensive physical exam.  History of musculoskeletal pain treated with meloxicam 15 mg daily.  He has a history of pure hypercholesterolemi   He was walking back to his car from work at [employer] in 2020 on the afternoon of November 17.  He stepped into a  grate in the parking lot which was not secured and fell into a hole striking and injuring his left ankle.  [facility] was [facility] to [facility] where an x-ray of the left ankle was negative for fracture and he subsequently improved.   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  He had lumbar disc surgery at [facility] in [place] but cannot remember the exact level.  We could not find the records in care everywhere.  Subsequently had left lower extremity radiculopathy in 2013 and was found to have left disc extrusion L4-L5 and had surgery by Dr. Venetia Maxon and did well.  History of fractured clavicle in the remote past.  No chronic medications.  No known drug allergies.  History of pure hypercholesterolemia but has not wanted to be on statin medication.  Had excellent HDL of 70 when checked in August 2023.  Normal fasting glucose at that time as well.  Normal BUN and creatinine.  Had colonoscopy by Dr. Leone Payor in 2022. He had 2 adenomatous polyps and a hyperplastic polyp. Recall is in 2027.    Social history: He is married.  Completed 2 years of college.  He is a native of [address] and previously served in the [employer] in [place].  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   Family history: Father with history of hyperlipidemia and hypertension still living in his 13s.  Mother died of metastatic breast cancer.  1 brother in his 36s with a history of hypertension and hyperlipidemia.    Vaccine counseling: due for tetanus vaccine.  Past Medical History:  Diagnosis Date   Hx of adenomatous colonic polyps 12/24/2020   No pertinent past medical history      Family History  Problem Relation Age of Onset   Breast cancer Mother    Colon cancer Father 30   Hypertension Father    Colon cancer Cousin    Colon cancer Cousin    Esophageal cancer Neg Hx    Stomach cancer Neg Hx     Social History   Social History Narrative   Not on file      ROS      Objective:   Vitals: There were no vitals taken for this visit.   Physical Exam    Results:   Studies obtained and personally reviewed by me:  Imaging, colonoscopy, mammogram, bone density scan, echocardiogram, heart cath, stress test, CT calcium score, etc. ***   Labs:       Component Value Date/Time   NA 138 06/03/2022 1120   K 5.2 06/03/2022 1120   CL 102 06/03/2022  1120   CO2 27 06/03/2022 1120   GLUCOSE 97 06/03/2022 1120   BUN 14 06/03/2022 1120   CREATININE 0.88 06/03/2022 1120   CALCIUM 10.2 06/03/2022 1120   PROT 7.5 06/03/2022 1120   AST 24 06/03/2022 1120   ALT 31 06/03/2022 1120   BILITOT 0.8 06/03/2022 1120   GFRNONAA 83 03/21/2017 0933   GFRAA 96 03/21/2017 0933     Lab Results  Component Value Date   WBC 6.7 06/03/2022   HGB 16.2 06/03/2022   HCT 48.6 06/03/2022   MCV 86.8 06/03/2022   PLT 296 06/03/2022    Lab Results  Component Value Date   CHOL 254 (H) 06/03/2022   HDL 89 06/03/2022   LDLCALC 138 (H) 06/03/2022   TRIG 138 06/03/2022   CHOLHDL 2.9 06/03/2022    No results found for: "HGBA1C"   Lab Results  Component Value Date   TSH 0.44 03/27/2017     Lab Results  Component Value  Date   PSA 1.98 06/03/2022   PSA 2.24 09/17/2021   PSA 1.81 09/01/2020   *** delete for male pts  ***    Assessment & Plan:   ***    I,Alexander Ruley,acting as a scribe for Margaree Mackintosh, MD.,have documented all relevant documentation on the behalf of Margaree Mackintosh, MD,as directed by  Margaree Mackintosh, MD while in the presence of Margaree Mackintosh, MD.   ***

## 2022-10-07 ENCOUNTER — Encounter: Payer: No Typology Code available for payment source | Admitting: Internal Medicine

## 2022-11-07 ENCOUNTER — Telehealth: Payer: Self-pay | Admitting: Internal Medicine

## 2022-11-07 NOTE — Telephone Encounter (Signed)
Javius Sylla [phone]  Rich called to say he hurt his back at work yesterday and needs a workman's comp form completed. I let him know we do not do file workman's comp or complete workman comp forms , so it would be best if he goes to an ortho doctor for that. I gave him the phone number for [facility].

## 2022-11-29 ENCOUNTER — Telehealth: Payer: Self-pay | Admitting: Family Medicine

## 2022-11-29 ENCOUNTER — Encounter: Payer: Self-pay | Admitting: Internal Medicine

## 2022-11-29 NOTE — Telephone Encounter (Signed)
error 

## 2022-11-29 NOTE — Telephone Encounter (Signed)
Per MyChart message with this pt, Jeffrey Burton has approved seeing him. However, TOC needs to be approved by pt to proceed with scheduling.

## 2022-12-05 NOTE — Telephone Encounter (Signed)
Caller Name Nana Hoselton Caller Phone Number [phone] Patient Name Pookela Sellin Patient DOB 1965-07-01 Call Type Message Only Information Provided Reason for Call Request for General Office Information Initial Comment Caller missed call from [facility] in the office last week- Caller wanting to become new patient of Dr. Carmelia Roller. Disp. Time Disposition Final User 12/05/2022 8:10:11 AM General Information Provided Yes Tiburcio Pea, Lanette Call Closed By: Evette Doffing Transaction Date/Time: 12/05/2022 8:07:53 AM (ET)

## 2022-12-06 NOTE — Telephone Encounter (Signed)
Pt was called and verified that he wanted to transfer his care from Marlan Palau, MD to Arva Chafe, DO.   After discussing with Marchelle Folks, noted [facility] is NOT [place] and does not require a TOC.  Will cal back to schedule appt with pt.

## 2022-12-19 ENCOUNTER — Ambulatory Visit (HOSPITAL_BASED_OUTPATIENT_CLINIC_OR_DEPARTMENT_OTHER)
Admission: EM | Admit: 2022-12-19 | Discharge: 2022-12-19 | Disposition: A | Discharge status: Home / Self Care | Payer: 59 | Attending: Internal Medicine | Admitting: Internal Medicine

## 2022-12-19 ENCOUNTER — Encounter (HOSPITAL_BASED_OUTPATIENT_CLINIC_OR_DEPARTMENT_OTHER): Payer: Self-pay

## 2022-12-19 DIAGNOSIS — R42 Dizziness and giddiness: Secondary | ICD-10-CM | POA: Diagnosis not present

## 2022-12-19 NOTE — ED Notes (Signed)
Patient is being discharged from the Urgent Care and sent to the Emergency Department via POV . Per Angie,PA, patient is in need of higher level of care due to need of further evaluation. Patient is aware and verbalizes understanding of plan of care.  Vitals:   12/19/22 1332  BP: (!) 165/58  Pulse: (!) 58  Resp: 18  Temp: (!) 97.5 F (36.4 C)  SpO2: 97%

## 2022-12-19 NOTE — Discharge Instructions (Signed)
Go to the emergency department for evaluation.     

## 2022-12-19 NOTE — ED Triage Notes (Signed)
Pt states for the past 2wks on the way to work every other day had tingling down both arms, swimmy headed, and nausea. 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 States Dr. Kirtland Bouchard in his office did a EKG and told him to come here for blood work.

## 2022-12-19 NOTE — ED Provider Notes (Signed)
[facility]    CSN: [id number] Arrival date & time: 12/19/22  1320      History   Chief Complaint Chief Complaint  Patient presents with   Nausea    HPI Jeffrey Burton is a 57 y.o. male.   HPI For 2 weeks has had intermittent episodes while driving to work of tingling down both arms associated with tingling down both anterior lower legs, feeling of "swimmy headed", nausea and feeling of consciously needing to take a deep breath.  These episodes have happened approximately every other day typically last about 10 minutes.  Today while at work developed similar symptoms but symptoms lasted approximately 45 minutes, and he felt as though he were going to pass out.  He works performing echocardiograms, a Personal assistant did an EKG which showed normal sinus rhythm.  The doctor recommended he come and get some blood work done.  Patient states he continues to feel swimmy headed and nauseous but the tingling has improved. He denies headache, change in vision, chest pain, palpitations, abdominal pain, vomiting, diarrhea, weakness, neck pain.  He denies increased stress or history of panic or anxiety attacks.  Denies diaphoresis, smoking, drug use.  Admits occasional beer on the weekends.  Past medical history includes high cholesterol, back surgery and knee surgery.  No daily medications or over-the-counter supplements, no known drug allergies.   Family history positive for colon cancer, breast cancer, hypertension, diabetes.  Past Medical History:  Diagnosis Date   Hx of adenomatous colonic polyps 12/24/2020   No pertinent past medical history     Patient Active Problem List   Diagnosis Date Noted   Hand arthritis 06/19/2022   Family history of colon cancer 12/24/2020   Hx of adenomatous colonic polyps 12/24/2020    Past Surgical History:  Procedure Laterality Date   BACK SURGERY     KNEE SURGERY     left knee   LUMBAR LAMINECTOMY/DECOMPRESSION MICRODISCECTOMY   02/21/2012   Procedure: LUMBAR LAMINECTOMY/DECOMPRESSION MICRODISCECTOMY 1 LEVEL;  Surgeon: Maeola Harman, MD;  Location: [facility];  Service: Neurosurgery;  Laterality: Left;  Redo Left Lumbar four-five Microdiskectomy       Home Medications    Prior to Admission medications   Not on File    Family History Family History  Problem Relation Age of Onset   Breast cancer Mother    Colon cancer Father 38   Hypertension Father    Colon cancer Cousin    Colon cancer Cousin    Esophageal cancer Neg Hx    Stomach cancer Neg Hx     Social History Social History   Tobacco Use   Smoking status: Never   Smokeless tobacco: Never  Vaping Use   Vaping status: Never Used  Substance Use Topics   Alcohol use: Yes    Alcohol/week: 2.0 standard drinks of alcohol    Types: 2 Glasses of wine per week    Comment: 2 glasses Saturday night occ   Drug use: No     Allergies   Patient has no known allergies.   Review of Systems Review of Systems  Constitutional:  Negative for appetite change, chills, diaphoresis and fever.  HENT:  Negative for trouble swallowing.   Respiratory:  Positive for shortness of breath. Negative for chest tightness.   Cardiovascular:  Negative for chest pain and palpitations.  Gastrointestinal:  Positive for nausea. Negative for abdominal pain and vomiting.  Neurological:  Positive for light-headedness. Negative for syncope, weakness, numbness and  headaches.  Psychiatric/Behavioral:  Negative for confusion. The patient is not nervous/anxious.      Physical Exam Triage Vital Signs ED Triage Vitals  Encounter Vitals Group     BP 12/19/22 1332 (!) 165/58     Systolic BP Percentile --      Diastolic BP Percentile --      Pulse Rate 12/19/22 1332 (!) 58     Resp 12/19/22 1332 18     Temp 12/19/22 1332 (!) 97.5 F (36.4 C)     Temp Source 12/19/22 1332 Oral     SpO2 12/19/22 1332 97 %     Weight --      Height --      Head Circumference --      Peak  Flow --      Pain Score 12/19/22 1334 0     Pain Loc --      Pain Education --      Exclude from Growth Chart --    No data found.  Updated Vital Signs BP (!) 165/58 (BP Location: Right Arm)   Pulse (!) 58   Temp (!) 97.5 F (36.4 C) (Oral)   Resp 18   SpO2 97%   Visual Acuity Right Eye Distance:   Left Eye Distance:   Bilateral Distance:    Right Eye Near:   Left Eye Near:    Bilateral Near:     Physical Exam Vitals and nursing note reviewed.  Constitutional:      Appearance: He is not ill-appearing or toxic-appearing.  HENT:     Head: Normocephalic and atraumatic.     Right Ear: Tympanic membrane and ear canal normal.     Left Ear: Tympanic membrane and ear canal normal.     Nose: No rhinorrhea.     Mouth/Throat:     Mouth: Mucous membranes are moist.     Pharynx: Oropharynx is clear.  Eyes:     Extraocular Movements: Extraocular movements intact.     Conjunctiva/sclera: Conjunctivae normal.     Pupils: Pupils are equal, round, and reactive to light.  Cardiovascular:     Rate and Rhythm: Bradycardia present.     Heart sounds: Normal heart sounds.  Pulmonary:     Effort: Pulmonary effort is normal. No respiratory distress.     Breath sounds: Normal breath sounds. No wheezing or rales.  Abdominal:     General: Bowel sounds are normal.     Palpations: Abdomen is soft.     Tenderness: There is no abdominal tenderness. There is no guarding.  Musculoskeletal:        General: Normal range of motion.     Cervical back: Neck supple.  Lymphadenopathy:     Cervical: No cervical adenopathy.  Skin:    General: Skin is warm and dry.  Neurological:     General: No focal deficit present.     Mental Status: He is alert and oriented to person, place, and time.  Psychiatric:        Mood and Affect: Mood normal.      UC Treatments / Results  Labs (all labs ordered are listed, but only abnormal results are displayed) Labs Reviewed - No data to  display  EKG   Radiology No results found.  Procedures Procedures (including critical care time)  Medications Ordered in UC Medications - No data to display  Initial Impression / Assessment and Plan / UC Course  I have reviewed the triage vital signs and the nursing  notes.  Pertinent labs & imaging results that were available during my care of the patient were reviewed by me and considered in my medical decision making (see chart for details).     57 year old male with episodes of paresthesias, swimmy headedness nausea, presyncopal feeling and feeling of needing to take a deep breath, episodes have been brief every other day today persistent.  Had EKG done at another office showing normal sinus rhythm.  He is hypertensive at 165/58, bradycardic at 58 otherwise his exam is normal.  Recommend ED evaluation.  He is stable to drive self to ED Final Clinical Impressions(s) / UC Diagnoses   Final diagnoses:  None   Discharge Instructions   None    ED Prescriptions   None    PDMP not reviewed this encounter.   Meliton Rattan, [place] 12/19/22 1409

## 2023-03-18 ENCOUNTER — Ambulatory Visit (INDEPENDENT_AMBULATORY_CARE_PROVIDER_SITE_OTHER): Payer: 59 | Admitting: Family Medicine

## 2023-03-18 ENCOUNTER — Encounter: Payer: Self-pay | Admitting: Family Medicine

## 2023-03-18 ENCOUNTER — Other Ambulatory Visit (HOSPITAL_BASED_OUTPATIENT_CLINIC_OR_DEPARTMENT_OTHER): Payer: Self-pay

## 2023-03-18 VITALS — BP 138/78 | HR 56 | Temp 98.0°F | Resp 16 | Ht 72.0 in | Wt 268.2 lb

## 2023-03-18 DIAGNOSIS — E669 Obesity, unspecified: Secondary | ICD-10-CM

## 2023-03-18 DIAGNOSIS — M79671 Pain in right foot: Secondary | ICD-10-CM | POA: Diagnosis not present

## 2023-03-18 MED ORDER — PHENTERMINE HCL 37.5 MG PO CAPS
37.5000 mg | ORAL_CAPSULE | ORAL | 0 refills | Status: DC
  Filled 2023-03-18: qty 30, 30d supply, fill #0

## 2023-03-18 NOTE — Patient Instructions (Signed)
 Consider Powerstep insoles. There are very quality over the counter inserts. Shop around online and in stores. Dr. Margart Sickles is a cheaper alternative, though is not as high of quality.   Ice/cold pack over area for 10-15 min twice daily.  OK to take Tylenol 1000 mg (2 extra strength tabs) or 975 mg (3 regular strength tabs) every 6 hours as needed.  Keep the diet clean and stay active.  Aim to do some physical exertion for 150 minutes per week. This is typically divided into 5 days per week, 30 minutes per day. The activity should be enough to get your heart rate up. Anything is better than nothing if you have time constraints.  Please consider adding some weight resistance exercise to your routine. Consider yoga as well.   Let us know if you need anything.

## 2023-03-18 NOTE — Progress Notes (Signed)
 Chief Complaint  Patient presents with   Establish Care    Establishing Care       New Patient Visit SUBJECTIVE: HPI: Jeffrey Burton is an 58 y.o.male who is being seen for establishing care.  The patient was previously seen at Dr. Beryle Quant.  Struggled w weight since he was a child. Diet is fair. He does not exercise routinely. He has never been on a medication.  He has never been through a supervised weight loss program.  He notes he would be interested in the medication if possible.  Over the past 3 months, he has had outside foot pain.  No new shoes, injury, or change in activity.  He denies any bruises, redness, or swelling.  No neurologic signs or symptoms.  Has not tried thing at home so far.  Past Medical History:  Diagnosis Date   Hx of adenomatous colonic polyps 12/24/2020   Past Surgical History:  Procedure Laterality Date   KNEE SURGERY Left 1993   arthroscopic   LUMBAR LAMINECTOMY/DECOMPRESSION MICRODISCECTOMY  02/21/2012   Procedure: LUMBAR LAMINECTOMY/DECOMPRESSION MICRODISCECTOMY 1 LEVEL;  Surgeon: Maeola Harman, MD;  Location: [facility];  Service: Neurosurgery;  Laterality: Left;  Redo Left Lumbar four-five Microdiskectomy   LUMBAR LAMINECTOMY/DECOMPRESSION MICRODISCECTOMY  1990   Family History  Problem Relation Age of Onset   Breast cancer Mother    Colon cancer Father 52   Hypertension Father    Diabetes Father    Colon cancer Cousin    Colon cancer Cousin    Esophageal cancer Neg Hx    Stomach cancer Neg Hx    No Known Allergies  Current Outpatient Medications:    phentermine 37.5 MG capsule, Take 1 capsule (37.5 mg total) by mouth every morning., Disp: 30 capsule, Rfl: 0  OBJECTIVE: BP 138/78 (BP Location: Left Arm, Patient Position: Sitting)   Pulse (!) 56   Temp 98 F (36.7 C) (Oral)   Resp 16   Ht 6' (1.829 m)   Wt 268 lb 3.2 oz (121.7 kg)   SpO2 96%   BMI 36.37 kg/m  General:  well developed, well nourished, in no apparent  distress Skin:  no significant moles, warts, or growths on exposed skin Lungs:  clear to auscultation, breath sounds equal bilaterally, no respiratory distress Cardio:  regular rate and rhythm, no LE edema or bruits Musculoskeletal: TTP over the base of the fifth metatarsal, no deformity, no pain with axial loading, no erythema, ecchymosis, edema Neuro:  gait normal Psych: well oriented with normal range of affect and appropriate judgment/insight  ASSESSMENT/PLAN: Obesity (BMI 30-39.9) - Plan: phentermine 37.5 MG capsule  Right foot pain  Chronic, not controlled.  Start phentermine 37.5 mg daily.  Counseled on diet and exercise.  Follow-up in 1 month.  Discussed risks and side effects associate with medication. Chronic, not controlled.  Arch supports recommended.  He does have high arches.  We will see how he does over the next month and if still having issues, will refer to Ortho foot and ankle. The patient voiced understanding and agreement to the plan.   Jilda Roche [place], DO 03/18/23  5:08 PM

## 2023-04-14 ENCOUNTER — Ambulatory Visit (INDEPENDENT_AMBULATORY_CARE_PROVIDER_SITE_OTHER): Payer: 59 | Admitting: Family Medicine

## 2023-04-14 ENCOUNTER — Encounter: Payer: Self-pay | Admitting: Family Medicine

## 2023-04-14 VITALS — BP 134/82 | HR 60 | Temp 98.3°F | Ht 72.0 in | Wt 264.8 lb

## 2023-04-14 DIAGNOSIS — Z125 Encounter for screening for malignant neoplasm of prostate: Secondary | ICD-10-CM

## 2023-04-14 DIAGNOSIS — Z114 Encounter for screening for human immunodeficiency virus [HIV]: Secondary | ICD-10-CM

## 2023-04-14 DIAGNOSIS — Z0000 Encounter for general adult medical examination without abnormal findings: Secondary | ICD-10-CM

## 2023-04-14 DIAGNOSIS — Z1159 Encounter for screening for other viral diseases: Secondary | ICD-10-CM

## 2023-04-14 LAB — LIPID PANEL
Cholesterol: 249 mg/dL — ABNORMAL HIGH (ref 0–200)
HDL: 74.4 mg/dL (ref >39.00)
LDL Cholesterol: 155 mg/dL — ABNORMAL HIGH (ref 0–99)
NonHDL: 174.15
Total CHOL/HDL Ratio: 3
Triglycerides: 98 mg/dL (ref 0.0–149.0)
VLDL: 19.6 mg/dL (ref 0.0–40.0)

## 2023-04-14 LAB — CBC
HCT: 46.6 % (ref 39.0–52.0)
Hemoglobin: 15.6 g/dL (ref 13.0–17.0)
MCHC: 33.3 g/dL (ref 30.0–36.0)
MCV: 89.6 fl (ref 78.0–100.0)
Platelets: 279 10*3/uL (ref 150.0–400.0)
RBC: 5.21 Mil/uL (ref 4.22–5.81)
RDW: 14 % (ref 11.5–15.5)
WBC: 5.3 10*3/uL (ref 4.0–10.5)

## 2023-04-14 LAB — COMPREHENSIVE METABOLIC PANEL
ALT: 40 U/L (ref 0–53)
AST: 26 U/L (ref 0–37)
Albumin: 4.7 g/dL (ref 3.5–5.2)
Alkaline Phosphatase: 53 U/L (ref 39–117)
BUN: 13 mg/dL (ref 6–23)
CO2: 29 meq/L (ref 19–32)
Calcium: 10.2 mg/dL (ref 8.4–10.5)
Chloride: 102 meq/L (ref 96–112)
Creatinine, Ser: 0.99 mg/dL (ref 0.40–1.50)
GFR: 83.61 mL/min (ref >60.00)
Glucose, Bld: 106 mg/dL — ABNORMAL HIGH (ref 70–99)
Potassium: 5.1 meq/L (ref 3.5–5.1)
Sodium: 140 meq/L (ref 135–145)
Total Bilirubin: 1 mg/dL (ref 0.2–1.2)
Total Protein: 7 g/dL (ref 6.0–8.3)

## 2023-04-14 LAB — PSA: PSA: 3.14 ng/mL (ref 0.10–4.00)

## 2023-04-14 NOTE — Patient Instructions (Addendum)
 Give [place] 2-3 business days to get the results of your labs back.   Keep the diet clean and stay active.  Please get me a copy of your advanced directive form at your convenience.   Send me a message if you are interested in seeing [facility] medical weight loss team.   Let us know if you need anything.

## 2023-04-14 NOTE — Progress Notes (Signed)
 Chief Complaint  Patient presents with   Annual Exam    Patient presents today for a physical exam   Quality Metric Gaps    HIV&Hep C screening, TDAP, COVID    Well Male Jeffrey Burton is here for a complete physical.   His last physical was >1 year ago.  Current diet: in general, diet is fair.   Current exercise: lifting wts, rowing Weight trend: intentionally decreased Fatigue out of ordinary? No. Seat belt? Yes.   Advanced directive? No  Health maintenance Shingrix- No Colonoscopy- Yes Tetanus- Yes HIV- No Hep C- No   Past Medical History:  Diagnosis Date   Hx of adenomatous colonic polyps 12/24/2020      Past Surgical History:  Procedure Laterality Date   KNEE SURGERY Left 1993   arthroscopic   LUMBAR LAMINECTOMY/DECOMPRESSION MICRODISCECTOMY  02/21/2012   Procedure: LUMBAR LAMINECTOMY/DECOMPRESSION MICRODISCECTOMY 1 LEVEL;  Surgeon: Maeola Harman, MD;  Location: [facility];  Service: Neurosurgery;  Laterality: Left;  Redo Left Lumbar four-five Microdiskectomy   LUMBAR LAMINECTOMY/DECOMPRESSION MICRODISCECTOMY  1990    Medications  Takes no meds routinely.     Allergies No Known Allergies  Family History Family History  Problem Relation Age of Onset   Breast cancer Mother    Colon cancer Father 72   Hypertension Father    Diabetes Father    Colon cancer Cousin    Colon cancer Cousin    Esophageal cancer Neg Hx    Stomach cancer Neg Hx     Review of Systems: Constitutional:  no fevers Eye:  no recent significant change in vision Ear/Nose/Mouth/Throat:  Ears:  no hearing loss Nose/Mouth/Throat:  no complaints of nasal congestion, no sore throat Cardiovascular:  no chest pain Respiratory:  no shortness of breath Gastrointestinal:  no change in bowel habits GU:  Male: negative for dysuria, frequency Musculoskeletal/Extremities:  no joint pain Integumentary (Skin/Breast):  no abnormal skin lesions reported Neurologic:  no  headaches Endocrine: No unexpected weight changes Hematologic/Lymphatic:  no abnormal bleeding  Exam BP 134/82   Pulse 60   Temp 98.3 F (36.8 C)   Ht 6' (1.829 m)   Wt 264 lb 12.8 oz (120.1 kg)   SpO2 98%   BMI 35.91 kg/m  General:  well developed, well nourished, in no apparent distress Skin:  no significant moles, warts, or growths Head:  no masses, lesions, or tenderness Eyes:  pupils equal and round, sclera anicteric without injection Ears:  canals without lesions, TMs shiny without retraction, no obvious effusion, no erythema Nose:  nares patent, mucosa normal Throat/Pharynx:  lips and gingiva without lesion; tongue and uvula midline; non-inflamed pharynx; no exudates or postnasal drainage Neck: neck supple without adenopathy, thyromegaly, or masses Cardiac: RRR, no bruits, no LE edema Lungs:  clear to auscultation, breath sounds equal bilaterally, no respiratory distress Abdomen: BS+, soft, non-tender, non-distended, no masses or organomegaly noted Rectal: Deferred Musculoskeletal:  symmetrical muscle groups noted without atrophy or deformity Neuro:  gait normal; deep tendon reflexes normal and symmetric Psych: well oriented with normal range of affect and appropriate judgment/insight  Assessment and Plan  Well adult exam - Plan: CBC, Comprehensive metabolic panel, Lipid panel  Screening for HIV without presence of risk factors - Plan: HIV Antibody (routine testing w rflx)  Encounter for hepatitis C screening test for low risk patient - Plan: Hepatitis C antibody  Screening for prostate cancer - Plan: PSA   Well 58 y.o. male. Counseled on diet and exercise. Counseled on  risks and benefits of prostate cancer screening with PSA. The patient agrees to undergo testing. Advanced directive form provided today.  Hep C and HIV screening as above.  Immunizations, labs, and further orders as above. Follow up in 1 yr. The patient voiced understanding and agreement to the  plan.  Jilda Roche [place], DO 04/14/23 10:29 AM

## 2023-04-15 ENCOUNTER — Other Ambulatory Visit: Payer: Self-pay

## 2023-04-15 DIAGNOSIS — E7800 Pure hypercholesterolemia, unspecified: Secondary | ICD-10-CM

## 2023-04-15 DIAGNOSIS — Z125 Encounter for screening for malignant neoplasm of prostate: Secondary | ICD-10-CM

## 2023-04-15 DIAGNOSIS — R7309 Other abnormal glucose: Secondary | ICD-10-CM

## 2023-04-15 LAB — HIV ANTIBODY (ROUTINE TESTING W REFLEX): HIV 1&2 Ab, 4th Generation: NONREACTIVE

## 2023-04-15 LAB — HEPATITIS C ANTIBODY: Hepatitis C Ab: NONREACTIVE

## 2023-04-15 NOTE — Progress Notes (Signed)
 Called patient and no answer left vm to return call

## 2023-04-16 ENCOUNTER — Ambulatory Visit (INDEPENDENT_AMBULATORY_CARE_PROVIDER_SITE_OTHER)

## 2023-04-16 ENCOUNTER — Encounter: Payer: Self-pay | Admitting: Family Medicine

## 2023-04-16 DIAGNOSIS — R7309 Other abnormal glucose: Secondary | ICD-10-CM

## 2023-04-16 LAB — HEMOGLOBIN A1C: Hgb A1c MFr Bld: 5.7 % (ref 4.6–6.5)

## 2023-04-21 ENCOUNTER — Encounter: Payer: Self-pay | Admitting: Family Medicine

## 2023-04-21 ENCOUNTER — Ambulatory Visit (INDEPENDENT_AMBULATORY_CARE_PROVIDER_SITE_OTHER): Admitting: Family Medicine

## 2023-04-21 ENCOUNTER — Other Ambulatory Visit (HOSPITAL_BASED_OUTPATIENT_CLINIC_OR_DEPARTMENT_OTHER): Payer: Self-pay

## 2023-04-21 VITALS — BP 136/78 | HR 74 | Temp 97.8°F | Ht 72.0 in | Wt 266.4 lb

## 2023-04-21 DIAGNOSIS — M79672 Pain in left foot: Secondary | ICD-10-CM | POA: Diagnosis not present

## 2023-04-21 DIAGNOSIS — M79671 Pain in right foot: Secondary | ICD-10-CM | POA: Diagnosis not present

## 2023-04-21 MED ORDER — MELOXICAM 15 MG PO TABS
15.0000 mg | ORAL_TABLET | Freq: Every day | ORAL | 0 refills | Status: DC
  Filled 2023-04-21: qty 21, 21d supply, fill #0

## 2023-04-21 NOTE — Patient Instructions (Addendum)
 Ice/cold pack over area for 10-15 min twice daily.  Heat (pad or rice pillow in microwave) over affected area, 10-15 minutes twice daily.   OK to take Tylenol 1000 mg (2 extra strength tabs) or 975 mg (3 regular strength tabs) every 6 hours as needed.  If you do not hear anything about your referral in the next 1-2 weeks, call our office and ask for an update.  Consider a [place] sock to wear at night.   Let us know if you need anything.  Plantar Fasciitis Stretches/exercises Do exercises exactly as told by your health care provider and adjust them as directed. It is normal to feel mild stretching, pulling, tightness, or discomfort as you do these exercises, but you should stop right away if you feel sudden pain or your pain gets worse.   Stretching and range of motion exercises These exercises warm up your muscles and joints and improve the movement and flexibility of your foot. These exercises also help to relieve pain.  Exercise A: Plantar fascia stretch Sit with your left / right leg crossed over your opposite knee. Hold your heel with one hand with that thumb near your arch. With your other hand, hold your toes and gently pull them back toward the top of your foot. You should feel a stretch on the bottom of your toes or your foot or both. Hold this stretch for 30 seconds. Slowly release your toes and return to the starting position. Repeat 2 times. Complete this exercise 3 times per week.  Exercise B: Gastroc, standing Stand with your hands against a wall. Extend your left / right leg behind you, and bend your front knee slightly. Keeping your heels on the floor and keeping your back knee straight, shift your weight toward the wall without arching your back. You should feel a gentle stretch in your left / right calf. Hold this position for 30 seconds. Repeat 2 times. Complete this exercise 3 times a week. Exercise C: Soleus, standing Stand with your hands against a  wall. Extend your left / right leg behind you, and bend your front knee slightly. Keeping your heels on the floor, bend your back knee and slightly shift your weight over the back leg. You should feel a gentle stretch deep in your calf. Hold this position for 30 seconds. Repeat 2 times. Complete this exercise 3 times per week. Exercise D: Gastrocsoleus, standing Stand with the ball of your left / right foot on a step. The ball of your foot is on the walking surface, right under your toes. Keep your other foot firmly on the same step. Hold onto the wall or a railing for balance. Slowly lift your other foot, allowing your body weight to press your heel down over the edge of the step. You should feel a stretch in your left / right calf. Hold this position for 30 seconds. Return both feet to the step. Repeat this exercise with a slight bend in your left / right knee. Repeat 2 times with your left / right knee straight and 2times with your left / right knee bent. Complete this exercise 3 times a week.  Balance exercise This exercise builds your balance and strength control of your arch to help take pressure off your plantar fascia. Exercise E: Single leg stand Without shoes, stand near a railing or in a doorway. You may hold onto the railing or door frame as needed. Stand on your left / right foot. Keep your big toe down  on the floor and try to keep your arch lifted. Do not let your foot roll inward. Hold this position for 30 seconds. If this exercise is too easy, you can try it with your eyes closed or while standing on a pillow. Repeat 2 times. Complete this exercise 3 times per week. This information is not intended to replace advice given to you by your health care provider. Make sure you discuss any questions you have with your health care provider. Document Released: 01/14/2005 Document Revised: 09/19/2015 Document Reviewed: 11/28/2014 Elsevier Interactive Patient Education  2017 [employer].

## 2023-04-21 NOTE — Progress Notes (Signed)
 Musculoskeletal Exam  Patient: Jeffrey Burton DOB: [birth year]  DOS: 04/21/2023  SUBJECTIVE:  Chief Complaint:   Chief Complaint  Patient presents with   Acute Visit    Patient presents today for bilateral foot pain for a bout a month now. He reports right foot is the worse.     Jeffrey Burton is a 58 y.o.  male for evaluation and treatment of bilateral foot pain.   Onset:  4 mo ago. No inj or change in activity.  Location: lateral foot and base of heel Character:  sharp  Progression of issue:  is unchanged Associated symptoms: no swelling, redness, bruising Treatment: to date has been rest, ice, OTC NSAIDS, and arch supports.   Neurovascular symptoms: no  Past Medical History:  Diagnosis Date   Hx of adenomatous colonic polyps 12/24/2020    Objective: VITAL SIGNS: BP 136/78   Pulse 74   Temp 97.8 F (36.6 C)   Ht 6' (1.829 m)   Wt 266 lb 6.4 oz (120.8 kg)   SpO2 98%   BMI 36.13 kg/m  Constitutional: Well formed, well developed. No acute distress. Thorax & Lungs: No accessory muscle use Musculoskeletal: feet.   Tenderness to palpation: Yes over proximal insertion of the plantar fascia bilaterally worse on the right, also over the lateral portion of the foot over the base of the fifth metatarsal bilaterally, worse on the right Deformity: no Ecchymosis: no Neurologic: Normal sensory function. No focal deficits noted. DTR's equal and symmetric in LE's. No clonus. Psychiatric: Normal mood. Age appropriate judgment and insight. Alert & oriented x 3.    Assessment:  Bilateral foot pain - Plan: Ambulatory referral to Podiatry, meloxicam (MOBIC) 15 MG tablet  Plan: Stretches/exercises for plantar fasciitis, heat, ice, Tylenol.  Meloxicam as needed.  Refer to podiatry for further evaluation. F/u as originally scheduled. The patient voiced understanding and agreement to the plan.   Jilda Roche [place], DO 04/21/23  4:45 PM

## 2023-05-05 ENCOUNTER — Ambulatory Visit (INDEPENDENT_AMBULATORY_CARE_PROVIDER_SITE_OTHER): Admitting: Podiatry

## 2023-05-05 ENCOUNTER — Ambulatory Visit (INDEPENDENT_AMBULATORY_CARE_PROVIDER_SITE_OTHER)

## 2023-05-05 ENCOUNTER — Encounter: Payer: Self-pay | Admitting: Podiatry

## 2023-05-05 DIAGNOSIS — M79671 Pain in right foot: Secondary | ICD-10-CM | POA: Diagnosis not present

## 2023-05-05 DIAGNOSIS — M79672 Pain in left foot: Secondary | ICD-10-CM | POA: Diagnosis not present

## 2023-05-05 DIAGNOSIS — M7751 Other enthesopathy of right foot: Secondary | ICD-10-CM | POA: Diagnosis not present

## 2023-05-05 DIAGNOSIS — M7752 Other enthesopathy of left foot: Secondary | ICD-10-CM | POA: Diagnosis not present

## 2023-05-05 DIAGNOSIS — M778 Other enthesopathies, not elsewhere classified: Secondary | ICD-10-CM

## 2023-05-05 DIAGNOSIS — M722 Plantar fascial fibromatosis: Secondary | ICD-10-CM

## 2023-05-05 MED ORDER — BETAMETHASONE SOD PHOS & ACET 6 (3-3) MG/ML IJ SUSP
3.0000 mg | Freq: Once | INTRAMUSCULAR | Status: AC
Start: 2023-05-05 — End: 2023-05-05
  Administered 2023-05-05: 3 mg via INTRA_ARTICULAR

## 2023-05-05 MED ORDER — MELOXICAM 15 MG PO TABS: 15.0000 mg | ORAL_TABLET | Freq: Every day | ORAL | 1 refills | Status: AC

## 2023-05-05 MED ORDER — METHYLPREDNISOLONE 4 MG PO TBPK: ORAL_TABLET | ORAL | 0 refills | Status: DC

## 2023-05-05 NOTE — Progress Notes (Signed)
   Chief Complaint  Patient presents with   Foot Pain    NP Bilateral foot pain on right foot is worse at this time Pain mostly in heels and along side of foot    Subjective: 58 y.o. male presenting today as a new patient for evaluation of bilateral heel pain.  Onset 6+ months.  Idiopathic gradual onset.  Denies a history of injury he has not really done anything for treatment.  He does wear good supportive tennis shoes with insoles.   Past Medical History:  Diagnosis Date   Hx of adenomatous colonic polyps 12/24/2020     Objective: Physical Exam General: The patient is alert and oriented x3 in no acute distress.  Dermatology: Skin is warm, dry and supple bilateral lower extremities. Negative for open lesions or macerations bilateral.   Vascular: Dorsalis Pedis and Posterior Tibial pulses palpable bilateral.  Capillary fill time is immediate to all digits.  Neurological: Grossly intact via light touch.   Musculoskeletal: Tenderness to palpation to the plantar aspect of the bilateral heels along the plantar fascia. All other joints range of motion within normal limits bilateral. Strength 5/5 in all groups bilateral.   Radiographic exam B/L feet 05/05/2023: Normal osseous mineralization. Joint spaces preserved. No fracture/dislocation/boney destruction. No other soft tissue abnormalities or radiopaque foreign bodies.   Assessment: 1. plantar fasciitis bilateral feet  Plan of Care:  -Patient evaluated. Xrays reviewed.   -Injection of 0.5cc Celestone soluspan injected into the bilateral heels.  -Prescription for Medrol Dosepak -Prescription for meloxicam 15 mg daily after completion of the Dosepak -Advise against going barefoot.  Recommend good supportive tennis shoes and sneakers. -Recommend OOFOS sandals around the home to avoid barefoot -Return to clinic 4 weeks  *Cardiac ultrasound specialist at [facility]  Felecia Shelling, DPM [facility]  Dr.  Felecia Shelling, DPM    [address] [place], [place] [address]                Office [phone]  Fax [phone]

## 2023-05-25 IMAGING — CR DG CHEST 2V
2 series · 2 of 2 positions shown · non-contrast
Comparison: None.

CLINICAL DATA: 55-year-old male with a history suspected pneumonia

EXAM:
CHEST - 2 VIEW

[chest pa]
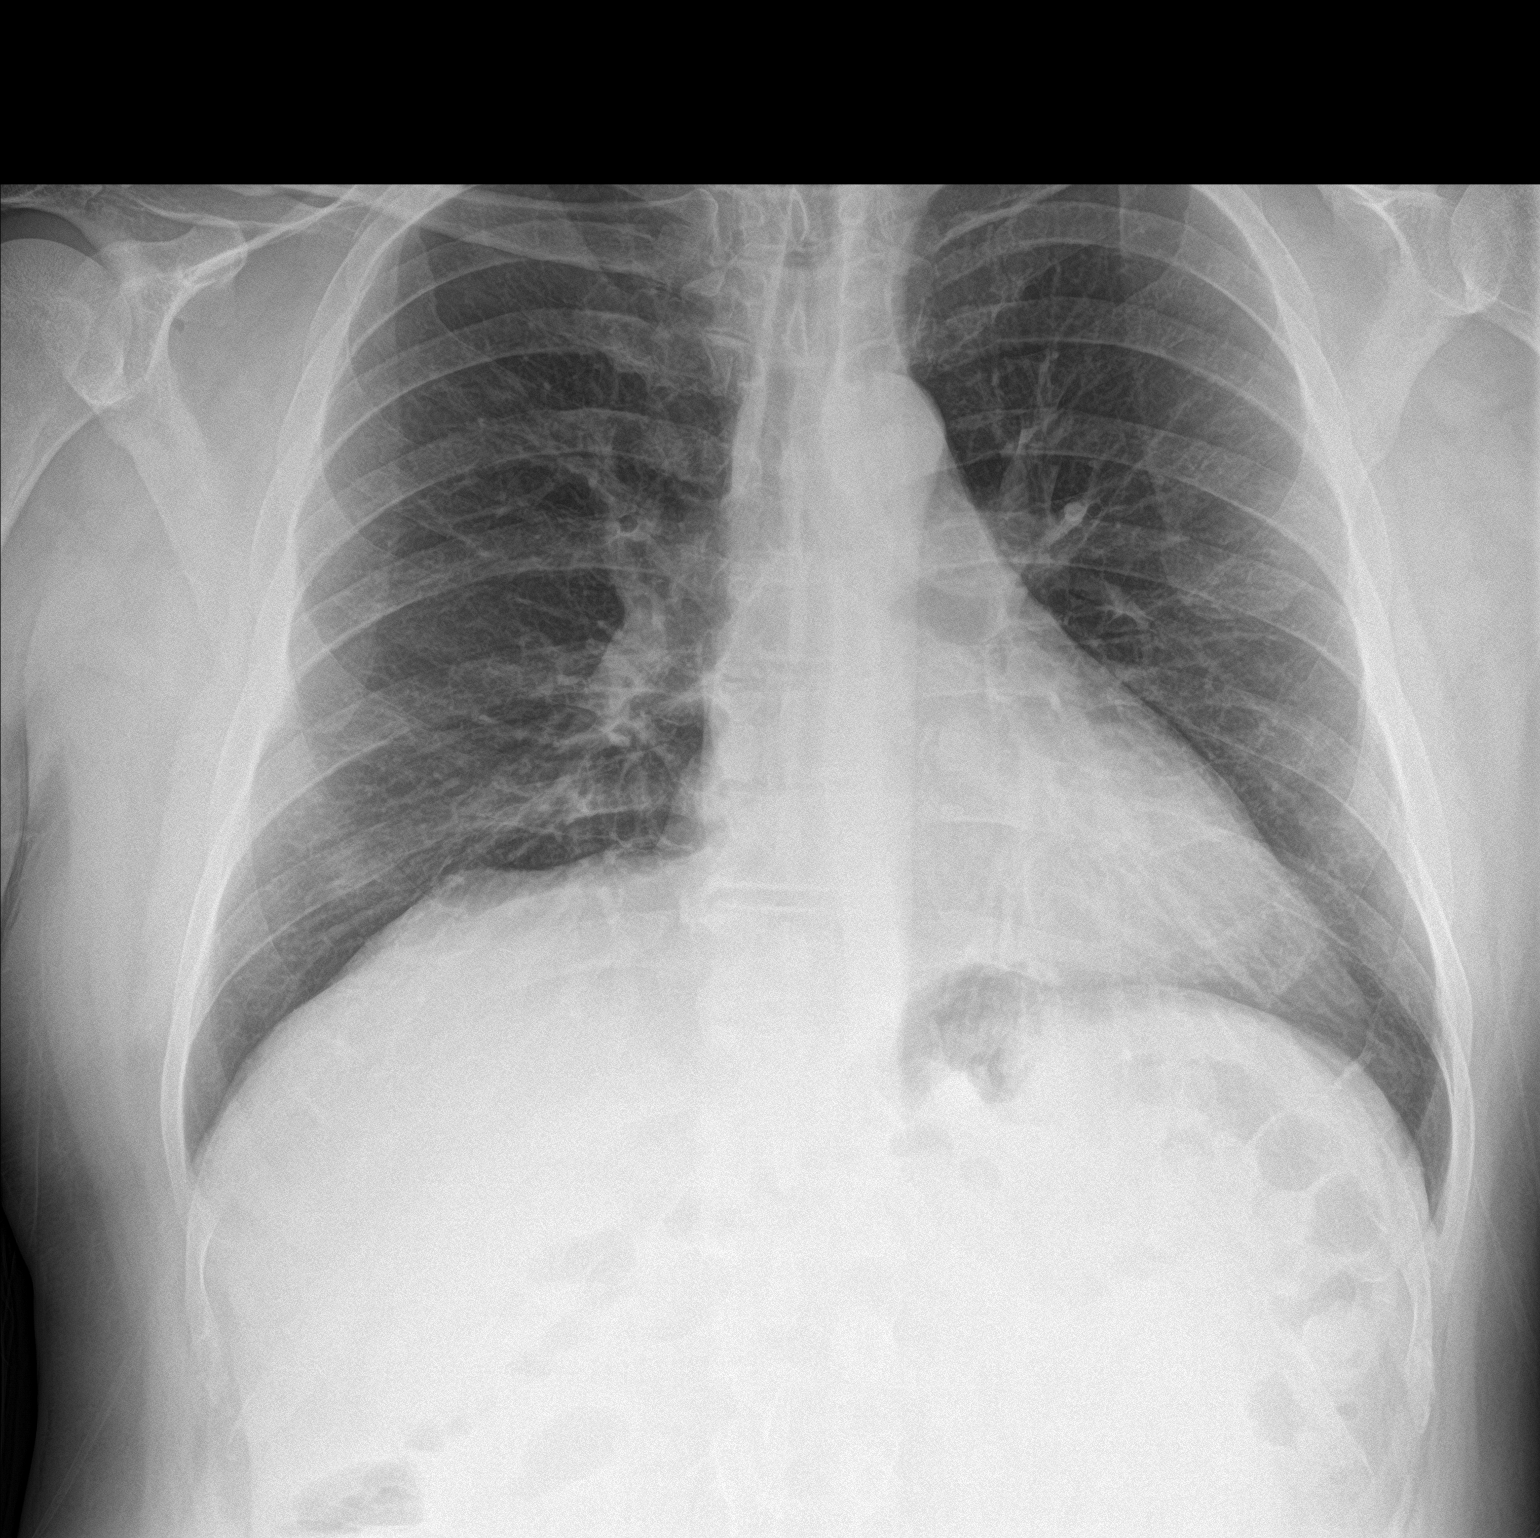

[chest lat]
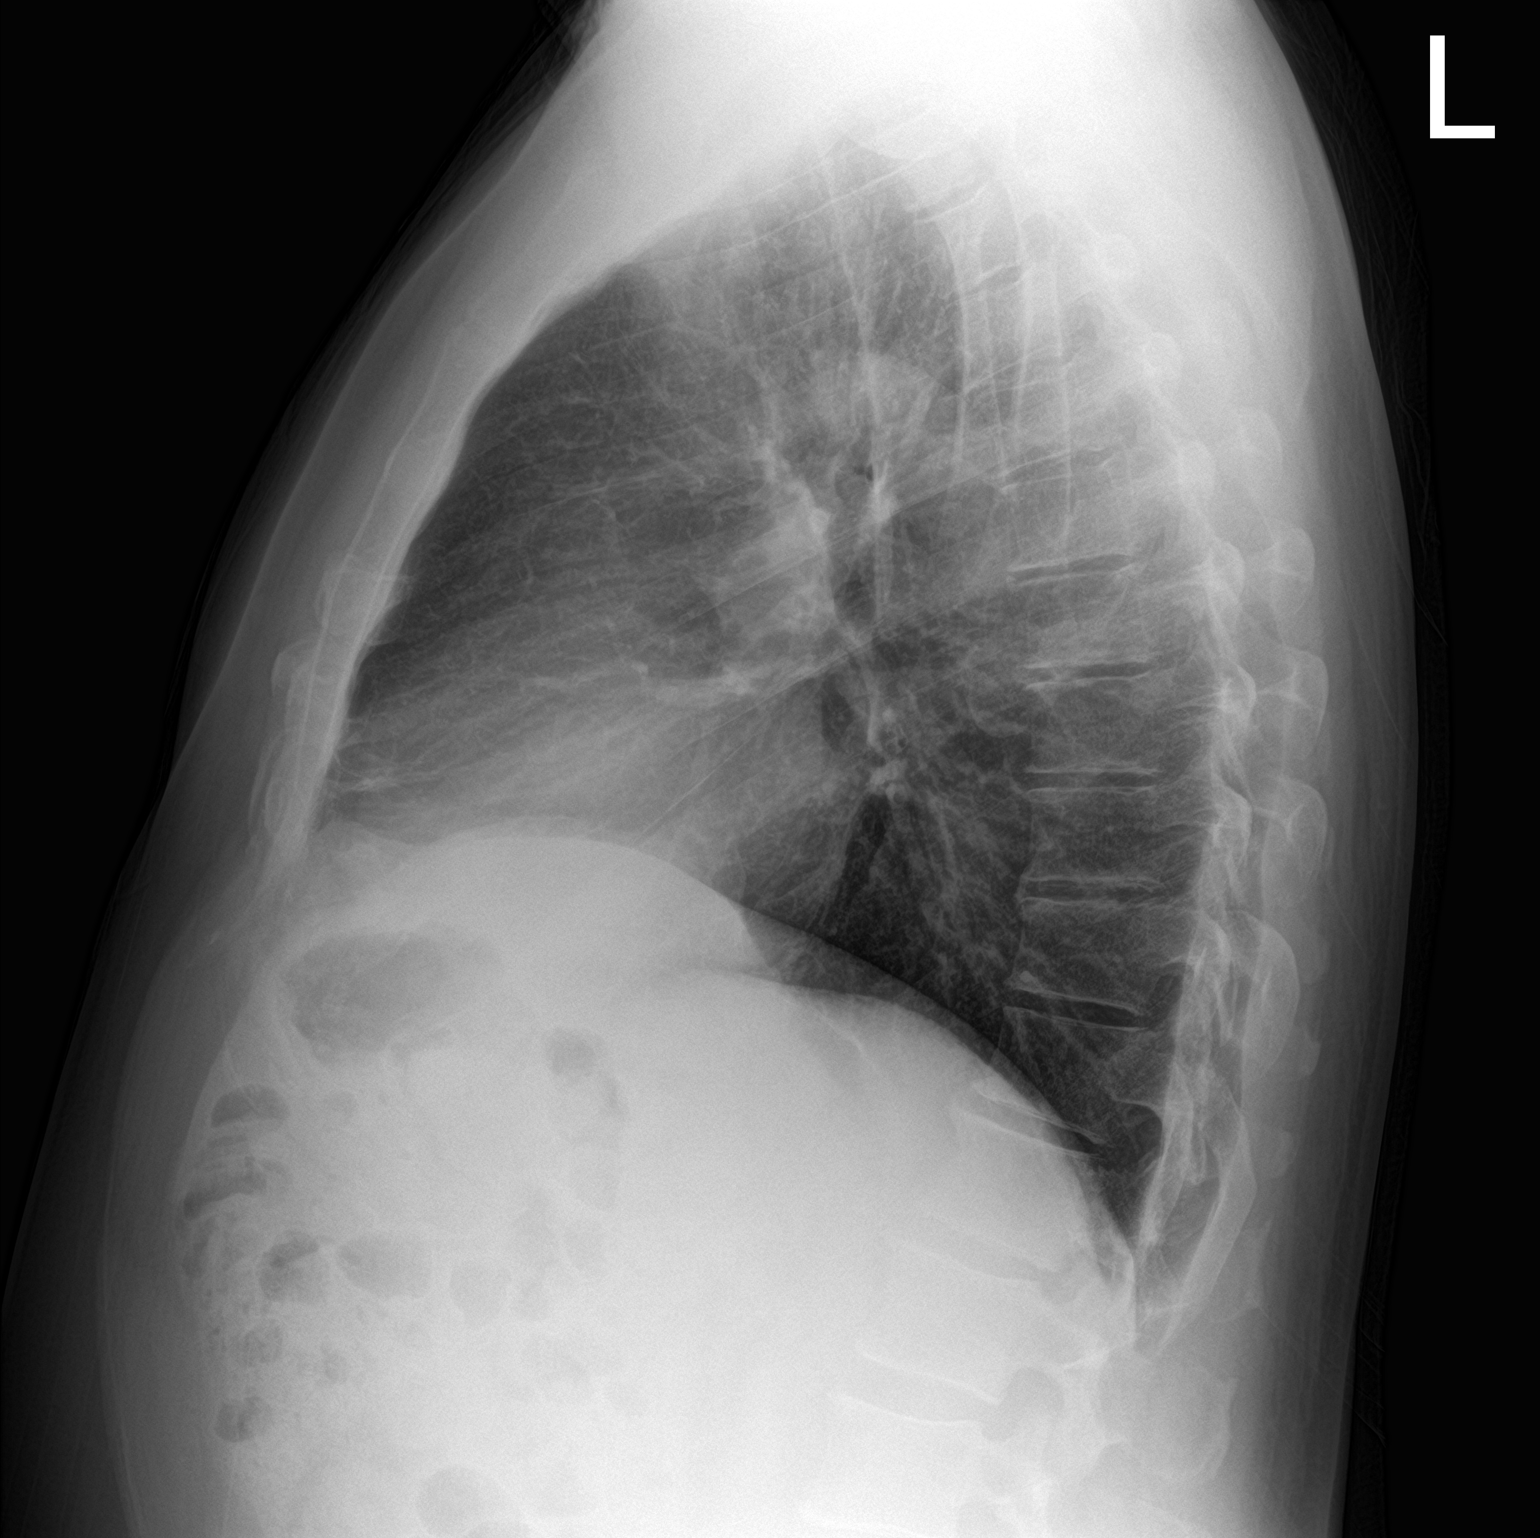

[2 of 2 positions shown; findings below may reference images not displayed]

FINDINGS: Cardiomediastinal silhouette within normal limits in size and
contour. No evidence of central vascular congestion. No interlobular
septal thickening.

No pneumothorax or pleural effusion. Coarsened interstitial
markings, with no confluent airspace disease.

No acute displaced fracture. Degenerative changes of the spine.
IMPRESSION: No active cardiopulmonary disease.

## 2023-06-02 ENCOUNTER — Ambulatory Visit (INDEPENDENT_AMBULATORY_CARE_PROVIDER_SITE_OTHER): Admitting: Podiatry

## 2023-06-02 ENCOUNTER — Other Ambulatory Visit (INDEPENDENT_AMBULATORY_CARE_PROVIDER_SITE_OTHER)

## 2023-06-02 ENCOUNTER — Encounter: Payer: Self-pay | Admitting: Family Medicine

## 2023-06-02 ENCOUNTER — Encounter: Payer: Self-pay | Admitting: Podiatry

## 2023-06-02 ENCOUNTER — Other Ambulatory Visit: Payer: Self-pay | Admitting: Family Medicine

## 2023-06-02 VITALS — Ht 72.0 in | Wt 266.0 lb

## 2023-06-02 DIAGNOSIS — M62461 Contracture of muscle, right lower leg: Secondary | ICD-10-CM | POA: Diagnosis not present

## 2023-06-02 DIAGNOSIS — Z125 Encounter for screening for malignant neoplasm of prostate: Secondary | ICD-10-CM

## 2023-06-02 DIAGNOSIS — M722 Plantar fascial fibromatosis: Secondary | ICD-10-CM

## 2023-06-02 DIAGNOSIS — E7800 Pure hypercholesterolemia, unspecified: Secondary | ICD-10-CM

## 2023-06-02 DIAGNOSIS — M62462 Contracture of muscle, left lower leg: Secondary | ICD-10-CM

## 2023-06-02 LAB — LIPID PANEL
Cholesterol: 258 mg/dL — ABNORMAL HIGH (ref 0–200)
HDL: 74.5 mg/dL (ref >39.00)
LDL Cholesterol: 166 mg/dL — ABNORMAL HIGH (ref 0–99)
NonHDL: 183.94
Total CHOL/HDL Ratio: 3
Triglycerides: 92 mg/dL (ref 0.0–149.0)
VLDL: 18.4 mg/dL (ref 0.0–40.0)

## 2023-06-02 LAB — PSA: PSA: 2.88 ng/mL (ref 0.10–4.00)

## 2023-06-02 MED ORDER — BETAMETHASONE SOD PHOS & ACET 6 (3-3) MG/ML IJ SUSP
3.0000 mg | Freq: Once | INTRAMUSCULAR | Status: AC
Start: 2023-06-02 — End: 2023-06-02
  Administered 2023-06-02: 3 mg via INTRA_ARTICULAR

## 2023-06-02 NOTE — Progress Notes (Signed)
   Chief Complaint  Patient presents with   Plantar Fasciitis    Patient is here for F/U for plantar fasciitis states there has been no change feet are very sore    Subjective: 58 y.o. male presenting today for follow-up evaluation of plantar fasciitis bilateral ongoing for 6+ months now.  Minimal improvement.  He continues to have pain and tenderness especially to the right plantar fascia.  Past Medical History:  Diagnosis Date   Hx of adenomatous colonic polyps 12/24/2020     Objective: Physical Exam General: The patient is alert and oriented x3 in no acute distress.  Dermatology: Skin is warm, dry and supple bilateral lower extremities. Negative for open lesions or macerations bilateral.   Vascular: Dorsalis Pedis and Posterior Tibial pulses palpable bilateral.  Capillary fill time is immediate to all digits.  Neurological: Grossly intact via light touch.   Musculoskeletal: No continue to be tenderness to palpation to the plantar aspect of the bilateral heels along the plantar fascia.  Tight posterior complex also noted with dorsiflexion of the ankle joint  Radiographic exam B/L feet 05/05/2023: Normal osseous mineralization. Joint spaces preserved. No fracture/dislocation/boney destruction. No other soft tissue abnormalities or radiopaque foreign bodies.   Assessment: 1. plantar fasciitis bilateral feet. RT>LT 2.  Gastroc equinus bilateral  Plan of Care:  -Patient evaluated.   -OTC power step insoles were dispensed to support the medial longitudinal arch of the foot -Injection of 0.5cc Celestone  soluspan injected right plantar fascia - Continue meloxicam  15 mg daily - Continue to advise against going barefoot.  Recommend good supportive tennis shoes and sneakers. -Recommend OOFOS sandals around the home to avoid barefoot -Recommend daily stretching exercises to alleviate the posterior tightness -Return to clinic 4 weeks  *Cardiac ultrasound specialist at [facility]  Dot Gazella, DPM [facility]  Dr. Dot Gazella, DPM    [address] [place], [place] [address]                Office [phone]  Fax [phone]

## 2023-06-03 ENCOUNTER — Telehealth (HOSPITAL_BASED_OUTPATIENT_CLINIC_OR_DEPARTMENT_OTHER): Payer: Self-pay

## 2023-06-25 ENCOUNTER — Telehealth (HOSPITAL_BASED_OUTPATIENT_CLINIC_OR_DEPARTMENT_OTHER): Payer: Self-pay

## 2023-07-07 ENCOUNTER — Ambulatory Visit (HOSPITAL_BASED_OUTPATIENT_CLINIC_OR_DEPARTMENT_OTHER)
Admission: RE | Admit: 2023-07-07 | Discharge: 2023-07-07 | Disposition: A | Discharge status: Home / Self Care | Payer: Self-pay | Source: Ambulatory Visit | Attending: Family Medicine | Admitting: Family Medicine

## 2023-07-07 DIAGNOSIS — E7800 Pure hypercholesterolemia, unspecified: Secondary | ICD-10-CM | POA: Insufficient documentation

## 2023-07-10 ENCOUNTER — Encounter: Payer: Self-pay | Admitting: Family Medicine

## 2023-07-11 ENCOUNTER — Ambulatory Visit: Payer: Self-pay | Admitting: Family Medicine

## 2023-07-18 ENCOUNTER — Other Ambulatory Visit (HOSPITAL_BASED_OUTPATIENT_CLINIC_OR_DEPARTMENT_OTHER): Payer: Self-pay

## 2023-07-18 ENCOUNTER — Other Ambulatory Visit: Payer: Self-pay

## 2023-07-18 ENCOUNTER — Other Ambulatory Visit: Payer: Self-pay | Admitting: Family Medicine

## 2023-07-18 DIAGNOSIS — E7800 Pure hypercholesterolemia, unspecified: Secondary | ICD-10-CM

## 2023-07-18 MED ORDER — ROSUVASTATIN CALCIUM 20 MG PO TABS
20.0000 mg | ORAL_TABLET | Freq: Every day | ORAL | 3 refills | Status: AC
  Filled 2023-07-18: qty 90, 90d supply, fill #0

## 2023-07-21 ENCOUNTER — Telehealth: Payer: Self-pay

## 2023-07-21 NOTE — Telephone Encounter (Signed)
 Message sent

## 2023-07-23 ENCOUNTER — Encounter: Payer: Self-pay | Admitting: Podiatry

## 2023-07-28 ENCOUNTER — Other Ambulatory Visit (HOSPITAL_BASED_OUTPATIENT_CLINIC_OR_DEPARTMENT_OTHER): Payer: Self-pay

## 2023-08-18 ENCOUNTER — Ambulatory Visit: Admitting: Podiatry
